# Patient Record
Sex: Female | Born: 1935 | Race: White | Hispanic: No | Smoking: Former smoker
Health system: Southern US, Community
[De-identification: ages and names within clinical notes are randomized; demographics above are authoritative.]

## PROBLEM LIST (undated history)

## (undated) HISTORY — PX: HERNIA REPAIR: SHX51

## (undated) HISTORY — PX: CHOLECYSTECTOMY: SHX55

## (undated) HISTORY — PX: FRACTURE SURGERY: SHX138

## (undated) HISTORY — PX: ABDOMINAL HYSTERECTOMY: SHX81

## (undated) HISTORY — PX: CATARACT EXTRACTION W/ INTRAOCULAR LENS  IMPLANT, BILATERAL: SHX1307

---

## 2007-09-10 ENCOUNTER — Emergency Department (HOSPITAL_COMMUNITY): Admission: EM | Admit: 2007-09-10 | Discharge: 2007-09-10 | Payer: Self-pay | Admitting: Emergency Medicine

## 2017-05-25 ENCOUNTER — Encounter (HOSPITAL_COMMUNITY): Payer: Self-pay | Admitting: *Deleted

## 2017-05-25 ENCOUNTER — Inpatient Hospital Stay (HOSPITAL_COMMUNITY): Payer: Medicare Other

## 2017-05-25 ENCOUNTER — Encounter (HOSPITAL_COMMUNITY)
Admission: AD | Disposition: A | Discharge status: Skilled Nursing Facility | Payer: Self-pay | Source: Other Acute Inpatient Hospital | Attending: Family Medicine

## 2017-05-25 ENCOUNTER — Inpatient Hospital Stay (HOSPITAL_COMMUNITY)
Admission: AD | Admit: 2017-05-25 | Discharge: 2017-06-01 | DRG: 481 | Disposition: A | Discharge status: Skilled Nursing Facility | Payer: Medicare Other | Source: Other Acute Inpatient Hospital | Attending: Internal Medicine | Admitting: Internal Medicine

## 2017-05-25 ENCOUNTER — Inpatient Hospital Stay (HOSPITAL_COMMUNITY): Payer: Medicare Other | Admitting: Certified Registered Nurse Anesthetist

## 2017-05-25 DIAGNOSIS — R410 Disorientation, unspecified: Secondary | ICD-10-CM | POA: Diagnosis not present

## 2017-05-25 DIAGNOSIS — M8000XA Age-related osteoporosis with current pathological fracture, unspecified site, initial encounter for fracture: Secondary | ICD-10-CM | POA: Diagnosis not present

## 2017-05-25 DIAGNOSIS — S72002A Fracture of unspecified part of neck of left femur, initial encounter for closed fracture: Secondary | ICD-10-CM | POA: Diagnosis not present

## 2017-05-25 DIAGNOSIS — I129 Hypertensive chronic kidney disease with stage 1 through stage 4 chronic kidney disease, or unspecified chronic kidney disease: Secondary | ICD-10-CM | POA: Diagnosis present

## 2017-05-25 DIAGNOSIS — M810 Age-related osteoporosis without current pathological fracture: Secondary | ICD-10-CM | POA: Diagnosis present

## 2017-05-25 DIAGNOSIS — M21372 Foot drop, left foot: Secondary | ICD-10-CM | POA: Diagnosis not present

## 2017-05-25 DIAGNOSIS — D72829 Elevated white blood cell count, unspecified: Secondary | ICD-10-CM | POA: Diagnosis present

## 2017-05-25 DIAGNOSIS — Z6820 Body mass index (BMI) 20.0-20.9, adult: Secondary | ICD-10-CM | POA: Diagnosis not present

## 2017-05-25 DIAGNOSIS — D62 Acute posthemorrhagic anemia: Secondary | ICD-10-CM | POA: Diagnosis not present

## 2017-05-25 DIAGNOSIS — W1830XA Fall on same level, unspecified, initial encounter: Secondary | ICD-10-CM | POA: Diagnosis present

## 2017-05-25 DIAGNOSIS — F0390 Unspecified dementia without behavioral disturbance: Secondary | ICD-10-CM | POA: Diagnosis present

## 2017-05-25 DIAGNOSIS — R262 Difficulty in walking, not elsewhere classified: Secondary | ICD-10-CM | POA: Diagnosis not present

## 2017-05-25 DIAGNOSIS — I10 Essential (primary) hypertension: Secondary | ICD-10-CM | POA: Diagnosis not present

## 2017-05-25 DIAGNOSIS — S72009A Fracture of unspecified part of neck of unspecified femur, initial encounter for closed fracture: Secondary | ICD-10-CM

## 2017-05-25 DIAGNOSIS — N189 Chronic kidney disease, unspecified: Secondary | ICD-10-CM

## 2017-05-25 DIAGNOSIS — N183 Chronic kidney disease, stage 3 (moderate): Secondary | ICD-10-CM | POA: Diagnosis present

## 2017-05-25 DIAGNOSIS — M25552 Pain in left hip: Secondary | ICD-10-CM | POA: Diagnosis present

## 2017-05-25 DIAGNOSIS — Z66 Do not resuscitate: Secondary | ICD-10-CM | POA: Diagnosis present

## 2017-05-25 DIAGNOSIS — E46 Unspecified protein-calorie malnutrition: Secondary | ICD-10-CM | POA: Diagnosis not present

## 2017-05-25 DIAGNOSIS — W19XXXA Unspecified fall, initial encounter: Secondary | ICD-10-CM

## 2017-05-25 DIAGNOSIS — Z79899 Other long term (current) drug therapy: Secondary | ICD-10-CM

## 2017-05-25 DIAGNOSIS — S72142A Displaced intertrochanteric fracture of left femur, initial encounter for closed fracture: Secondary | ICD-10-CM | POA: Diagnosis present

## 2017-05-25 DIAGNOSIS — I493 Ventricular premature depolarization: Secondary | ICD-10-CM | POA: Diagnosis not present

## 2017-05-25 DIAGNOSIS — Z23 Encounter for immunization: Secondary | ICD-10-CM | POA: Diagnosis present

## 2017-05-25 DIAGNOSIS — M25551 Pain in right hip: Secondary | ICD-10-CM | POA: Diagnosis not present

## 2017-05-25 DIAGNOSIS — R159 Full incontinence of feces: Secondary | ICD-10-CM | POA: Diagnosis present

## 2017-05-25 DIAGNOSIS — W19XXXD Unspecified fall, subsequent encounter: Secondary | ICD-10-CM | POA: Diagnosis not present

## 2017-05-25 DIAGNOSIS — R451 Restlessness and agitation: Secondary | ICD-10-CM | POA: Diagnosis not present

## 2017-05-25 DIAGNOSIS — E440 Moderate protein-calorie malnutrition: Secondary | ICD-10-CM | POA: Diagnosis present

## 2017-05-25 DIAGNOSIS — Z95810 Presence of automatic (implantable) cardiac defibrillator: Secondary | ICD-10-CM

## 2017-05-25 DIAGNOSIS — T148XXA Other injury of unspecified body region, initial encounter: Secondary | ICD-10-CM | POA: Diagnosis not present

## 2017-05-25 DIAGNOSIS — H9190 Unspecified hearing loss, unspecified ear: Secondary | ICD-10-CM | POA: Diagnosis not present

## 2017-05-25 DIAGNOSIS — D649 Anemia, unspecified: Secondary | ICD-10-CM

## 2017-05-25 DIAGNOSIS — N179 Acute kidney failure, unspecified: Secondary | ICD-10-CM | POA: Diagnosis present

## 2017-05-25 DIAGNOSIS — R296 Repeated falls: Secondary | ICD-10-CM

## 2017-05-25 DIAGNOSIS — Z96649 Presence of unspecified artificial hip joint: Secondary | ICD-10-CM

## 2017-05-25 HISTORY — PX: INTRAMEDULLARY (IM) NAIL INTERTROCHANTERIC: SHX5875

## 2017-05-25 HISTORY — PX: HIP FRACTURE SURGERY: SHX118

## 2017-05-25 LAB — COMPREHENSIVE METABOLIC PANEL
ALBUMIN: 3.4 g/dL — AB (ref 3.5–5.0)
ALK PHOS: 50 U/L (ref 38–126)
ALT: 13 U/L — ABNORMAL LOW (ref 14–54)
ANION GAP: 9 (ref 5–15)
AST: 21 U/L (ref 15–41)
BUN: 26 mg/dL — ABNORMAL HIGH (ref 6–20)
CALCIUM: 9.2 mg/dL (ref 8.9–10.3)
CO2: 27 mmol/L (ref 22–32)
Chloride: 106 mmol/L (ref 101–111)
Creatinine, Ser: 1.23 mg/dL — ABNORMAL HIGH (ref 0.44–1.00)
GFR calc Af Amer: 42 mL/min — ABNORMAL LOW (ref >60)
GFR calc non Af Amer: 36 mL/min — ABNORMAL LOW (ref >60)
GLUCOSE: 138 mg/dL — AB (ref 65–99)
POTASSIUM: 3.8 mmol/L (ref 3.5–5.1)
SODIUM: 142 mmol/L (ref 135–145)
Total Bilirubin: 0.7 mg/dL (ref 0.3–1.2)
Total Protein: 5.8 g/dL — ABNORMAL LOW (ref 6.5–8.1)

## 2017-05-25 LAB — CBC WITH DIFFERENTIAL/PLATELET
BASOS PCT: 0 %
Basophils Absolute: 0 10*3/uL (ref 0.0–0.1)
EOS ABS: 0 10*3/uL (ref 0.0–0.7)
Eosinophils Relative: 0 %
HCT: 27.8 % — ABNORMAL LOW (ref 36.0–46.0)
HEMOGLOBIN: 9.1 g/dL — AB (ref 12.0–15.0)
Lymphocytes Relative: 7 %
Lymphs Abs: 1.1 10*3/uL (ref 0.7–4.0)
MCH: 32.3 pg (ref 26.0–34.0)
MCHC: 32.7 g/dL (ref 30.0–36.0)
MCV: 98.6 fL (ref 78.0–100.0)
MONOS PCT: 7 %
Monocytes Absolute: 1 10*3/uL (ref 0.1–1.0)
NEUTROS PCT: 86 %
Neutro Abs: 12.3 10*3/uL — ABNORMAL HIGH (ref 1.7–7.7)
Platelets: 203 10*3/uL (ref 150–400)
RBC: 2.82 MIL/uL — ABNORMAL LOW (ref 3.87–5.11)
RDW: 13.5 % (ref 11.5–15.5)
WBC: 14.4 10*3/uL — AB (ref 4.0–10.5)

## 2017-05-25 LAB — MRSA PCR SCREENING: MRSA by PCR: NEGATIVE

## 2017-05-25 LAB — CREATININE, SERUM
CREATININE: 1.18 mg/dL — AB (ref 0.44–1.00)
GFR calc non Af Amer: 38 mL/min — ABNORMAL LOW (ref >60)
GFR, EST AFRICAN AMERICAN: 44 mL/min — AB (ref >60)

## 2017-05-25 LAB — PROTIME-INR
INR: 1.14
Prothrombin Time: 14.6 seconds (ref 11.4–15.2)

## 2017-05-25 LAB — CBC
HCT: 26.1 % — ABNORMAL LOW (ref 36.0–46.0)
Hemoglobin: 8.5 g/dL — ABNORMAL LOW (ref 12.0–15.0)
MCH: 32.2 pg (ref 26.0–34.0)
MCHC: 32.6 g/dL (ref 30.0–36.0)
MCV: 98.9 fL (ref 78.0–100.0)
PLATELETS: 204 10*3/uL (ref 150–400)
RBC: 2.64 MIL/uL — AB (ref 3.87–5.11)
RDW: 13.8 % (ref 11.5–15.5)
WBC: 17.2 10*3/uL — AB (ref 4.0–10.5)

## 2017-05-25 LAB — APTT: APTT: 25 s (ref 24–36)

## 2017-05-25 SURGERY — FIXATION, FRACTURE, INTERTROCHANTERIC, WITH INTRAMEDULLARY ROD
Anesthesia: Monitor Anesthesia Care | Site: Hip | Laterality: Left

## 2017-05-25 MED ORDER — OXYCODONE-ACETAMINOPHEN 5-325 MG PO TABS
1.0000 | ORAL_TABLET | ORAL | Status: DC | PRN
  Administered 2017-05-25 (×2): 1 via ORAL
  Filled 2017-05-25 (×2): qty 1

## 2017-05-25 MED ORDER — METOCLOPRAMIDE HCL 5 MG PO TABS: 5.0000 mg | ORAL_TABLET | Freq: Three times a day (TID) | ORAL | Status: DC | PRN

## 2017-05-25 MED ORDER — ENOXAPARIN SODIUM 40 MG/0.4ML ~~LOC~~ SOLN
40.0000 mg | SUBCUTANEOUS | Status: DC
  Filled 2017-05-25: qty 0.4

## 2017-05-25 MED ORDER — LISINOPRIL 10 MG PO TABS
10.0000 mg | ORAL_TABLET | Freq: Every day | ORAL | Status: DC
  Administered 2017-05-26: 10 mg via ORAL
  Filled 2017-05-25 (×2): qty 1

## 2017-05-25 MED ORDER — MENTHOL 3 MG MT LOZG: 1.0000 | LOZENGE | OROMUCOSAL | Status: DC | PRN

## 2017-05-25 MED ORDER — OXYCODONE HCL 5 MG PO TABS: 5.0000 mg | ORAL_TABLET | Freq: Once | ORAL | Status: DC | PRN

## 2017-05-25 MED ORDER — METHOCARBAMOL 1000 MG/10ML IJ SOLN
500.0000 mg | Freq: Four times a day (QID) | INTRAMUSCULAR | Status: DC | PRN
  Administered 2017-05-26 – 2017-05-29 (×4): 500 mg via INTRAVENOUS
  Filled 2017-05-25: qty 550
  Filled 2017-05-25 (×8): qty 5

## 2017-05-25 MED ORDER — MORPHINE SULFATE (PF) 4 MG/ML IV SOLN
0.5000 mg | INTRAVENOUS | Status: DC | PRN
  Administered 2017-05-25 – 2017-05-26 (×2): 0.52 mg via INTRAVENOUS
  Filled 2017-05-25 (×2): qty 1

## 2017-05-25 MED ORDER — PHENYLEPHRINE HCL 10 MG/ML IJ SOLN
INTRAMUSCULAR | Status: DC | PRN
  Administered 2017-05-25: 80 ug via INTRAVENOUS

## 2017-05-25 MED ORDER — MORPHINE SULFATE (PF) 4 MG/ML IV SOLN
INTRAVENOUS | Status: AC
  Filled 2017-05-25: qty 1

## 2017-05-25 MED ORDER — VITAMIN D 1000 UNITS PO TABS
1000.0000 [IU] | ORAL_TABLET | Freq: Every day | ORAL | Status: DC
  Administered 2017-05-25 – 2017-06-01 (×8): 1000 [IU] via ORAL
  Filled 2017-05-25 (×9): qty 1

## 2017-05-25 MED ORDER — METHOCARBAMOL 500 MG PO TABS
500.0000 mg | ORAL_TABLET | Freq: Three times a day (TID) | ORAL | Status: DC | PRN
  Administered 2017-05-25: 500 mg via ORAL
  Filled 2017-05-25: qty 1

## 2017-05-25 MED ORDER — SODIUM CHLORIDE 0.9 % IV SOLN
INTRAVENOUS | Status: DC
  Administered 2017-05-25 – 2017-05-26 (×2): via INTRAVENOUS

## 2017-05-25 MED ORDER — METOPROLOL TARTRATE 25 MG PO TABS
25.0000 mg | ORAL_TABLET | Freq: Every day | ORAL | Status: DC
  Administered 2017-05-25 – 2017-05-30 (×6): 25 mg via ORAL
  Filled 2017-05-25 (×6): qty 1

## 2017-05-25 MED ORDER — LISINOPRIL 10 MG PO TABS: 10.0000 mg | ORAL_TABLET | Freq: Every day | ORAL | Status: DC

## 2017-05-25 MED ORDER — CEFAZOLIN SODIUM-DEXTROSE 2-4 GM/100ML-% IV SOLN
2.0000 g | INTRAVENOUS | Status: AC
  Administered 2017-05-25: 2 g via INTRAVENOUS
  Filled 2017-05-25: qty 100

## 2017-05-25 MED ORDER — POVIDONE-IODINE 10 % EX SWAB
2.0000 "application " | Freq: Once | CUTANEOUS | Status: AC
  Administered 2017-05-25: 2 via TOPICAL

## 2017-05-25 MED ORDER — OXYCODONE HCL 5 MG PO TABS: 5.0000 mg | ORAL_TABLET | ORAL | 0 refills | Status: AC | PRN

## 2017-05-25 MED ORDER — ACETAMINOPHEN 325 MG PO TABS
650.0000 mg | ORAL_TABLET | Freq: Four times a day (QID) | ORAL | Status: DC | PRN
  Administered 2017-05-26: 650 mg via ORAL
  Filled 2017-05-25 (×2): qty 2

## 2017-05-25 MED ORDER — PROPOFOL 10 MG/ML IV BOLUS
INTRAVENOUS | Status: DC | PRN
  Administered 2017-05-25: 20 mg via INTRAVENOUS

## 2017-05-25 MED ORDER — ACETAMINOPHEN 650 MG RE SUPP: 650.0000 mg | Freq: Four times a day (QID) | RECTAL | Status: DC | PRN

## 2017-05-25 MED ORDER — HYDROCHLOROTHIAZIDE 10 MG/ML ORAL SUSPENSION: 6.2500 mg | Freq: Every day | ORAL | Status: DC

## 2017-05-25 MED ORDER — ALUM & MAG HYDROXIDE-SIMETH 200-200-20 MG/5ML PO SUSP: 30.0000 mL | ORAL | Status: DC | PRN

## 2017-05-25 MED ORDER — HYDRALAZINE HCL 20 MG/ML IJ SOLN: 5.0000 mg | INTRAMUSCULAR | Status: DC | PRN

## 2017-05-25 MED ORDER — ONDANSETRON HCL 4 MG/2ML IJ SOLN: 4.0000 mg | Freq: Three times a day (TID) | INTRAMUSCULAR | Status: DC | PRN

## 2017-05-25 MED ORDER — PHENOL 1.4 % MT LIQD
1.0000 | OROMUCOSAL | Status: DC | PRN
Start: 2017-05-25 — End: 2017-06-01

## 2017-05-25 MED ORDER — PROPOFOL 500 MG/50ML IV EMUL
INTRAVENOUS | Status: DC | PRN
  Administered 2017-05-25: 20 ug/kg/min via INTRAVENOUS

## 2017-05-25 MED ORDER — SODIUM CHLORIDE 0.9 % IV SOLN
INTRAVENOUS | Status: DC
  Administered 2017-05-25 – 2017-05-30 (×6): via INTRAVENOUS

## 2017-05-25 MED ORDER — CEFAZOLIN SODIUM-DEXTROSE 2-4 GM/100ML-% IV SOLN
2.0000 g | Freq: Four times a day (QID) | INTRAVENOUS | Status: AC
  Administered 2017-05-25 – 2017-05-26 (×3): 2 g via INTRAVENOUS
  Filled 2017-05-25 (×3): qty 100

## 2017-05-25 MED ORDER — ENOXAPARIN SODIUM 30 MG/0.3ML ~~LOC~~ SOLN: 30.0000 mg | SUBCUTANEOUS | 0 refills | Status: AC

## 2017-05-25 MED ORDER — OXYCODONE HCL 5 MG PO TABS
5.0000 mg | ORAL_TABLET | ORAL | Status: DC | PRN
  Administered 2017-05-25 – 2017-05-26 (×2): 10 mg via ORAL
  Administered 2017-05-28 – 2017-05-29 (×3): 5 mg via ORAL
  Administered 2017-05-30 – 2017-05-31 (×2): 10 mg via ORAL
  Filled 2017-05-25 (×2): qty 2
  Filled 2017-05-25 (×2): qty 1
  Filled 2017-05-25 (×2): qty 2
  Filled 2017-05-25: qty 1

## 2017-05-25 MED ORDER — PHENYLEPHRINE HCL 10 MG/ML IJ SOLN
INTRAVENOUS | Status: DC | PRN
  Administered 2017-05-25: 30 ug/min via INTRAVENOUS

## 2017-05-25 MED ORDER — ONDANSETRON HCL 4 MG PO TABS: 4.0000 mg | ORAL_TABLET | Freq: Four times a day (QID) | ORAL | Status: DC | PRN

## 2017-05-25 MED ORDER — ONDANSETRON HCL 4 MG/2ML IJ SOLN
INTRAMUSCULAR | Status: AC
  Filled 2017-05-25: qty 2

## 2017-05-25 MED ORDER — ACETAMINOPHEN 325 MG PO TABS
650.0000 mg | ORAL_TABLET | Freq: Four times a day (QID) | ORAL | Status: DC | PRN
  Administered 2017-05-30: 650 mg via ORAL
  Filled 2017-05-25: qty 2

## 2017-05-25 MED ORDER — METHOCARBAMOL 500 MG PO TABS
500.0000 mg | ORAL_TABLET | Freq: Four times a day (QID) | ORAL | Status: DC | PRN
  Administered 2017-05-29 – 2017-06-01 (×6): 500 mg via ORAL
  Filled 2017-05-25 (×7): qty 1

## 2017-05-25 MED ORDER — HYDROCODONE-ACETAMINOPHEN 5-325 MG PO TABS
1.0000 | ORAL_TABLET | Freq: Four times a day (QID) | ORAL | Status: DC | PRN
  Administered 2017-05-26: 2 via ORAL
  Administered 2017-05-26 – 2017-05-29 (×2): 1 via ORAL
  Administered 2017-05-30 – 2017-05-31 (×3): 2 via ORAL
  Administered 2017-05-31: 1 via ORAL
  Administered 2017-06-01 (×2): 2 via ORAL
  Filled 2017-05-25: qty 2
  Filled 2017-05-25: qty 1
  Filled 2017-05-25: qty 2
  Filled 2017-05-25: qty 1
  Filled 2017-05-25 (×3): qty 2
  Filled 2017-05-25: qty 1
  Filled 2017-05-25: qty 2

## 2017-05-25 MED ORDER — HYDROCHLOROTHIAZIDE 10 MG/ML ORAL SUSPENSION
6.2500 mg | Freq: Every day | ORAL | Status: DC
  Administered 2017-05-26: 6.25 mg via ORAL
  Filled 2017-05-25 (×2): qty 1.25

## 2017-05-25 MED ORDER — LISINOPRIL-HYDROCHLOROTHIAZIDE 20-12.5 MG PO TABS: 0.5000 | ORAL_TABLET | Freq: Every day | ORAL | Status: DC

## 2017-05-25 MED ORDER — 0.9 % SODIUM CHLORIDE (POUR BTL) OPTIME
TOPICAL | Status: DC | PRN
  Administered 2017-05-25: 1000 mL

## 2017-05-25 MED ORDER — OXYCODONE HCL 5 MG/5ML PO SOLN: 5.0000 mg | Freq: Once | ORAL | Status: DC | PRN

## 2017-05-25 MED ORDER — FENTANYL CITRATE (PF) 100 MCG/2ML IJ SOLN
25.0000 ug | INTRAMUSCULAR | Status: DC | PRN
Start: 2017-05-25 — End: 2017-05-25
  Administered 2017-05-25 (×4): 25 ug via INTRAVENOUS

## 2017-05-25 MED ORDER — ONDANSETRON HCL 4 MG/2ML IJ SOLN
4.0000 mg | Freq: Four times a day (QID) | INTRAMUSCULAR | Status: DC | PRN
  Administered 2017-05-25 (×2): 4 mg via INTRAVENOUS
  Filled 2017-05-25: qty 2

## 2017-05-25 MED ORDER — MORPHINE SULFATE (PF) 4 MG/ML IV SOLN
0.5000 mg | INTRAVENOUS | Status: DC | PRN
  Administered 2017-05-25 (×3): 0.52 mg via INTRAVENOUS
  Filled 2017-05-25 (×2): qty 1

## 2017-05-25 MED ORDER — FENTANYL CITRATE (PF) 250 MCG/5ML IJ SOLN
INTRAMUSCULAR | Status: AC
  Filled 2017-05-25: qty 5

## 2017-05-25 MED ORDER — ZOLPIDEM TARTRATE 5 MG PO TABS
5.0000 mg | ORAL_TABLET | Freq: Every evening | ORAL | Status: DC | PRN
  Administered 2017-05-25 – 2017-05-31 (×5): 5 mg via ORAL
  Filled 2017-05-25 (×5): qty 1

## 2017-05-25 MED ORDER — LACTATED RINGERS IV SOLN
INTRAVENOUS | Status: DC
  Administered 2017-05-25: 15:00:00 via INTRAVENOUS

## 2017-05-25 MED ORDER — TRANEXAMIC ACID 1000 MG/10ML IV SOLN
1000.0000 mg | INTRAVENOUS | Status: AC
  Administered 2017-05-25: 1000 mg via INTRAVENOUS
  Filled 2017-05-25: qty 10

## 2017-05-25 MED ORDER — METOCLOPRAMIDE HCL 5 MG/ML IJ SOLN: 5.0000 mg | Freq: Three times a day (TID) | INTRAMUSCULAR | Status: DC | PRN

## 2017-05-25 MED ORDER — FENTANYL CITRATE (PF) 100 MCG/2ML IJ SOLN
INTRAMUSCULAR | Status: AC
  Administered 2017-05-25: 25 ug via INTRAVENOUS
  Filled 2017-05-25: qty 2

## 2017-05-25 SURGICAL SUPPLY — 35 items
BNDG COHESIVE 4X5 TAN NS LF (GAUZE/BANDAGES/DRESSINGS) ×3 IMPLANT
BNDG COHESIVE 6X5 TAN STRL LF (GAUZE/BANDAGES/DRESSINGS) IMPLANT
BNDG GAUZE ELAST 4 BULKY (GAUZE/BANDAGES/DRESSINGS) ×3 IMPLANT
COVER PERINEAL POST (MISCELLANEOUS) ×3 IMPLANT
COVER SURGICAL LIGHT HANDLE (MISCELLANEOUS) ×3 IMPLANT
DRAPE STERI IOBAN 125X83 (DRAPES) ×3 IMPLANT
DRSG MEPILEX BORDER 4X4 (GAUZE/BANDAGES/DRESSINGS) ×6 IMPLANT
DRSG MEPILEX BORDER 4X8 (GAUZE/BANDAGES/DRESSINGS) ×3 IMPLANT
DRSG PAD ABDOMINAL 8X10 ST (GAUZE/BANDAGES/DRESSINGS) ×6 IMPLANT
DURAPREP 26ML APPLICATOR (WOUND CARE) ×3 IMPLANT
ELECT REM PT RETURN 9FT ADLT (ELECTROSURGICAL) ×3
ELECTRODE REM PT RTRN 9FT ADLT (ELECTROSURGICAL) ×1 IMPLANT
GLOVE SKINSENSE NS SZ7.5 (GLOVE) ×4
GLOVE SKINSENSE STRL SZ7.5 (GLOVE) ×2 IMPLANT
GOWN STRL REIN XL XLG (GOWN DISPOSABLE) ×3 IMPLANT
GUIDE PIN 3.2MM (MISCELLANEOUS) ×2
GUIDE PIN ORTH 343X3.2XBRAD (MISCELLANEOUS) ×1 IMPLANT
KIT BASIN OR (CUSTOM PROCEDURE TRAY) ×3 IMPLANT
KIT ROOM TURNOVER OR (KITS) ×3 IMPLANT
MANIFOLD NEPTUNE II (INSTRUMENTS) ×3 IMPLANT
NAIL FEM 11.5X1.5X340 125D LT (Nail) ×3 IMPLANT
NS IRRIG 1000ML POUR BTL (IV SOLUTION) ×3 IMPLANT
PACK GENERAL/GYN (CUSTOM PROCEDURE TRAY) ×3 IMPLANT
PAD ARMBOARD 7.5X6 YLW CONV (MISCELLANEOUS) ×6 IMPLANT
PAD CAST 4YDX4 CTTN HI CHSV (CAST SUPPLIES) ×2 IMPLANT
PADDING CAST COTTON 4X4 STRL (CAST SUPPLIES) ×4
SCREW LAG COMPR KIT 95/90 (Screw) ×3 IMPLANT
STAPLER VISISTAT 35W (STAPLE) ×6 IMPLANT
SUT VIC AB 0 CT1 27 (SUTURE) ×2
SUT VIC AB 0 CT1 27XBRD ANBCTR (SUTURE) ×1 IMPLANT
SUT VIC AB 2-0 CT1 27 (SUTURE) ×4
SUT VIC AB 2-0 CT1 TAPERPNT 27 (SUTURE) ×2 IMPLANT
TOWEL OR 17X24 6PK STRL BLUE (TOWEL DISPOSABLE) ×3 IMPLANT
TOWEL OR 17X26 10 PK STRL BLUE (TOWEL DISPOSABLE) ×3 IMPLANT
WATER STERILE IRR 1000ML POUR (IV SOLUTION) ×3 IMPLANT

## 2017-05-25 NOTE — Op Note (Signed)
   Date of Surgery: 05/25/2017  INDICATIONS: Charlotte Huber is a 83 y.o.-year-old female who sustained a left hip fracture. The risks and benefits of the procedure discussed with the patient prior to the procedure and all questions were answered; consent was obtained.  PREOPERATIVE DIAGNOSIS: left hip fracture   POSTOPERATIVE DIAGNOSIS: Same   PROCEDURE: Treatment of intertrochanteric fracture with intramedullary implant. CPT [phone]   SURGEON: N. Glee ArvinMichael Sherrine Salberg, M.D.   ASSIST: Starlyn SkeansMary Lindsey TiptonStanbery, [place]; necessary for the timely completion of procedure and due to complexity of procedure.  ANESTHESIA: general   IV FLUIDS AND URINE: See anesthesia record   ESTIMATED BLOOD LOSS: 200 cc  IMPLANTS: [employer]AN 11.5 x 34, 95/90  DRAINS: None.   COMPLICATIONS: None.   DESCRIPTION OF PROCEDURE: The patient was brought to the operating room and placed supine on the operating table. The patient's leg had been signed prior to the procedure. The patient had the anesthesia placed by the anesthesiologist. The prep verification and incision time-outs were performed to confirm that this was the correct patient, site, side and location. The patient had an SCD on the opposite lower extremity. The patient did receive antibiotics prior to the incision and was re-dosed during the procedure as needed at indicated intervals. The patient was positioned on the fracture table with the table in traction and internal rotation to reduce the hip. The well leg was placed in a scissor position and all bony prominences were well-padded. The patient had the lower extremity prepped and draped in the standard surgical fashion. The incision was made 4 finger breadths superior to the greater trochanter. A guide pin was inserted into the tip of the greater trochanter under fluoroscopic guidance. An opening reamer was used to gain access to the femoral canal. The nail length was measured and inserted down the femoral canal  to its proper depth. The appropriate version of insertion for the lag screw was found under fluoroscopy. A pin was inserted up the femoral neck through the jig. Then, a second antirotation pin was inserted inferior to the first pin. The length of the lag screw was then measured. The lag screw was inserted as near to center-center in the head as possible. The antirotation pin was then taken out and an interdigitating compression screw was placed in its place. The leg was taken out of traction, then the interdigitating compression screw was used to compress across the fracture. Compression was visualized on serial xrays. The wound was copiously irrigated with saline and the subcutaneous layer closed with 2.0 vicryl and the skin was reapproximated with staples. The wounds were cleaned and dried a final time and a sterile dressing was placed. The hip was taken through a range of motion at the end of the case under fluoroscopic imaging to visualize the approach-withdraw phenomenon and confirm implant length in the head. The patient was then awakened from anesthesia and taken to the recovery room in stable condition. All counts were correct at the end of the case.   POSTOPERATIVE PLAN: The patient will be weight bearing as tolerated and will return in 2 weeks for staple removal and the patient will receive DVT prophylaxis based on other medications, activity level, and risk ratio of bleeding to thrombosis.   Charlotte ReelN. Michael Conleigh Heinlein, MD [facility] [phone] 5:20 PM

## 2017-05-25 NOTE — Consult Note (Signed)
   ORTHOPAEDIC CONSULTATION  REQUESTING PHYSICIAN: Darlin DropHall, Carole N, DO  Chief Complaint: Left intertroch hip fracture  HPI: Charlotte Huber is a 83 y.o. female who presents with left hip fracture s/p mechanical fall PTA.  The patient endorses severe pain in the left hip, that does not radiate, grinding in quality, worse with any movement, better with immobilization.  Denies LOC/fever/chills/nausea/vomiting.  Walks without assistive devices (walker, cane, wheelchair).  Does live independently with sons.  Denies LOC, neck pain, abd pain.  PMHx is significant for pacemaker.  Social History   Socioeconomic History  . Marital status: Widowed    Spouse name: Not on file  . Number of children: Not on file  . Years of education: Not on file  . Highest education level: Not on file  Social Needs  . Financial resource strain: Not on file  . Food insecurity - worry: Not on file  . Food insecurity - inability: Not on file  . Transportation needs - medical: Not on file  . Transportation needs - non-medical: Not on file  Occupational History  . Not on file  Tobacco Use  . Smoking status: Not on file  Substance and Sexual Activity  . Alcohol use: Not on file  . Drug use: Not on file  . Sexual activity: Not on file  Other Topics Concern  . Not on file  Social History Narrative  . Not on file   No family history on file. Allergies not on file Prior to Admission medications   Not on File   No results found.  All pertinent xrays, MRI, CT independently reviewed and interpreted  Positive ROS: All other systems have been reviewed and were otherwise negative with the exception of those mentioned in the HPI and as above.  Physical Exam: General: Alert, no acute distress Cardiovascular: No pedal edema Respiratory: No cyanosis, no use of accessory musculature GI: No organomegaly, abdomen is soft and non-tender Skin: No lesions in the area of chief complaint Neurologic: Sensation intact  distally Psychiatric: Patient is competent for consent with normal mood and affect Lymphatic: No axillary or cervical lymphadenopathy  MUSCULOSKELETAL:  - pain with movement of the hip and extremity - skin intact - NVI distally - compartments soft  Assessment: Left intertroch hip fracture  Plan: - surgery is recommended, patient and family are aware of r/b/a and wish to proceed - consent obtained - medical optimization per primary team - surgery is planned for this afternoon pending medical clearance.  Thank you for the consult and the opportunity to see Ms. Prestridge  N. Glee ArvinMichael Rushawn Capshaw, MD [facility] [phone] 7:29 AM

## 2017-05-25 NOTE — Care Management (Signed)
This is a no charge note   [place] Dr. Lyman BishopSilver  83 year old lady with past medical history for hypertension, CAD, pacemaker placement, who presents with fall, and caused left hip fracture. Orthopedic surgeon, Dr. Rayburn MaBlackmon was consulted.   Patient was found to have hemoglobin 12.6, electrolytes renal function okay, blood pressure 169/82, heart rate 78, respiration rate 15, oxygen saturation normal on 2 L nasal cannula oxygen, temperature normal, negative urinalysis.  Patient is accepted to MedSurg bed as inpatient.  Please call manager of [facility] at [phone] when pt arrives to floor   Lorretta HarpXilin Jahzion Brogden, MD  [facility] Pager [phone]  If 7PM-7AM, please contact night-coverage [email or web address] Password [email or web address] 05/25/2017, 1:47 AM

## 2017-05-25 NOTE — Discharge Instructions (Signed)
° ° °  1. Change dressings as needed °2. May shower but keep incisions covered and dry °3. Take lovenox to prevent blood clots °4. Take stool softeners as needed °5. Take pain meds as needed ° °

## 2017-05-25 NOTE — Transfer of Care (Signed)
Immediate Anesthesia Transfer of Care Note  Patient: Charlotte Huber  Procedure(s) Performed: INTRAMEDULLARY (IM) NAIL INTERTROCHANTRIC (Left Hip)  Patient Location: PACU  Anesthesia Type:Spinal  Level of Consciousness: alert   Airway & Oxygen Therapy: Patient Spontanous Breathing  Post-op Assessment: Report given to RN and Post -op Vital signs reviewed and stable  Post vital signs: Reviewed and stable  Last Vitals:  Vitals:   05/25/17 0445 05/25/17 1400  BP: (!) 162/62 (!) 112/57  Pulse: 80 (!) 101  Resp: 19 20  Temp: 37.5 C 36.4 C  SpO2: 93% 97%    Last Pain:  Vitals:   05/25/17 1400  TempSrc: Axillary  PainSc:          Complications: No apparent anesthesia complications

## 2017-05-25 NOTE — H&P (Signed)
History and Physical  Charlotte Haskellheo W Down VOZ:[id number]:[id number] DOB: 06/15/1918 DOA: 05/25/2017  Referring physician: Dr. Judd Lienelo  PCP: No primary care provider on file.  Outpatient Specialists: None Patient coming from: Home  Chief Complaint: Fall and left hip pain  HPI: Charlotte Huber is a 83 y.o. female with medical history significant for HTN, dementia, AICD, osteoporosis [facility] after an unwitnessed fall that occurred yesterday around 1700. Patient had bowel incontinence earlier that day. Prior to that was in her usual state of health.   ED Course: Pelvis portable xray revealed: Comminuted intertrochanteric fracture on the left with varus angulation at fracture site. No other fractures are evident. No dislocation. There is symmetric moderate narrowing of the hip joints bilaterally. Bones are diffusely osteoporotic.   Orthopedic surgery consulted with plan for surgical repair this afternoon 05/25/17. Chest xray and EKG ordered for medical clearance.  Review of Systems:  Review of systems as noted in the HPI are otherwise negative   Social History:  has no tobacco, alcohol, and drug history on file. Lives at home with her son. Walks independently with no use of assistive device.  Allergies not on file  No family history on file.  Unable to obtain due to dementia.  Prior to Admission medications   Not on File    Physical Exam: BP (!) 162/62   Pulse 80   Temp 99.5 F (37.5 C)   Resp 19   Wt 52 kg (114 lb 10.2 oz)   SpO2 93%   General:  83 yo thin built CF. Appears uncomfortable due to right hip pain. Very hard of hearing. Eyes: Pupils are round and reactive to light. Sclerae anicteric  ENT: Mucous membrane dry. No erythema or exudates Neck: No JVD noted, no thyromegaly Cardiovascular: IRR no rubs or gallops Respiratory: CTA no wheezes or rales Abdomen: soft NT ND NBS x4  Skin: No noted rashes. On limited exam  Musculoskeletal: left leg is  shortened Psychiatric: Mood is appropriate for condition and setting  Neurologic: Unable to fully assessed limited by pain          Labs on Admission:  Basic Metabolic Panel: Recent Labs  Lab 05/25/17 0551  NA 142  K 3.8  CL 106  CO2 27  GLUCOSE 138*  BUN 26*  CREATININE 1.23*  CALCIUM 9.2   Liver Function Tests: Recent Labs  Lab 05/25/17 0551  AST 21  ALT 13*  ALKPHOS 50  BILITOT 0.7  PROT 5.8*  ALBUMIN 3.4*   No results for input(s): LIPASE, AMYLASE in the last 168 hours. No results for input(s): AMMONIA in the last 168 hours. CBC: Recent Labs  Lab 05/25/17 0551  WBC 14.4*  NEUTROABS 12.3*  HGB 9.1*  HCT 27.8*  MCV 98.6  PLT 203   Cardiac Enzymes: No results for input(s): CKTOTAL, CKMB, CKMBINDEX, TROPONINI in the last 168 hours.  BNP (last 3 results) No results for input(s): BNP in the last 8760 hours.  ProBNP (last 3 results) No results for input(s): PROBNP in the last 8760 hours.  CBG: No results for input(s): GLUCAP in the last 168 hours.  Radiological Exams on Admission: Dg Pelvis Portable  Result Date: 05/25/2017 CLINICAL DATA:  Pain following fall EXAM: PORTABLE PELVIS 1-2 VIEWS COMPARISON:  None. FINDINGS: There is a comminuted intertrochanteric femur fracture on the left with varus angulation at the fracture site. There is avulsion of the lesser trochanter medially. No other fracture is appreciable. No dislocation. Bones are  diffusely osteoporotic. There is moderate narrowing of both hip joints. IMPRESSION: Comminuted intertrochanteric fracture on the left with varus angulation at fracture site. No other fractures are evident. No dislocation. There is symmetric moderate narrowing of the hip joints bilaterally. Bones are diffusely osteoporotic. These results will be called to the ordering clinician or representative by the Radiologist Assistant, and communication documented in the PACS or zVision Dashboard. Electronically Signed   By: Bretta BangWilliam   Woodruff III M.D.   On: 05/25/2017 07:45    EKG: Independently reviewed.  No EKG available at the time of this evaluation. Ordered it.  Assessment/Plan Present on Admission: . Closed left hip fracture (HCC)  Active Problems:   Closed left hip fracture (HCC)  Acute left comminuted intertrochanteric fracture post presumed mechanical fall, poa -ortho following with plan for surgery this afternoon -NPO -SCDs -pain management -gentle IV fluid hydration -close monitoring of vital signs -EKG, CXR for medical clearance  Suspected mechanical Fall -Post surgery will require PT -Son states no recent fall prior to this event  Osteoporosis -Not treated -Fall precaution -At home on vitamin D and calcium -No previous assistive device to ambulate  Elevated creatinine with no baseline -cr 1.23, bun 26, GFR 36 -gentle IV fluid hydration -BMP am  Normocytic anemia -No baseline to compare -Hg 9.1 -transfuse if Hg drops below 7.0 -CBC am  Moderate protein calorie malnutrition -Ensure after procedure or nutritionist consult  HTN -no acute issues  Leukocytosis most likely reactive -wbc 14k, afebrile -No sign of active infective process  Reported recent bowel incontinence -Will monitor -No recurrence since in the hospital -Physical exam unremarkable     DVT prophylaxis: SCDs-per ortho   Code Status: DNR  Family Communication: with sons at bedside. All questions answered to their satisfaction.   Disposition Plan: will stay at least 2 midnights. Surgery planned today 05/25/17.  Consults called: Orthopedic surgery  Admission status: Inpatient    Darlin Droparole N Orlyn Odonoghue MD [facility] Pager [phone]  If 7PM-7AM, please contact night-coverage [email or web address] Password [email or web address]  05/25/2017, 7:58 AM

## 2017-05-25 NOTE — Anesthesia Preprocedure Evaluation (Signed)
Anesthesia Evaluation  Patient identified by MRN, date of birth, ID band Patient awake    Reviewed: Allergy & Precautions, NPO status , Patient's Chart, lab work & pertinent test results, reviewed documented beta blocker date and time   History of Anesthesia Complications Negative for: history of anesthetic complications  Airway Mallampati: II  TM Distance: >3 FB Neck ROM: Full    Dental  (+) Dental Advisory Given   Pulmonary neg pulmonary ROS,    breath sounds clear to auscultation       Cardiovascular hypertension, Pt. on medications and Pt. on home beta blockers + dysrhythmias + pacemaker  Rhythm:Regular     Neuro/Psych negative neurological ROS  negative psych ROS   GI/Hepatic negative GI ROS, Neg liver ROS,   Endo/Other  negative endocrine ROS  Renal/GU negative Renal ROS     Musculoskeletal   Abdominal   Peds  Hematology  (+) anemia ,   Anesthesia Other Findings   Reproductive/Obstetrics                             Anesthesia Physical Anesthesia Plan  ASA: III  Anesthesia Plan: MAC and Spinal   Post-op Pain Management:    Induction:   PONV Risk Score and Plan: 2 and Ondansetron and Treatment may vary due to age or medical condition  Airway Management Planned: Nasal Cannula  Additional Equipment: None  Intra-op Plan:   Post-operative Plan:   Informed Consent: I have reviewed the patients History and Physical, chart, labs and discussed the procedure including the risks, benefits and alternatives for the proposed anesthesia with the patient or authorized representative who has indicated his/her understanding and acceptance.   Dental advisory given and Consent reviewed with POA  Plan Discussed with: CRNA and Surgeon  Anesthesia Plan Comments:         Anesthesia Quick Evaluation

## 2017-05-25 NOTE — Progress Notes (Addendum)
Pt received per stretcher. Pt is alert and oriented and very hard of hearing. Pt grimaces upon movement. Pt on 2 L O2. [facility]. Awaiting for orders.

## 2017-05-26 ENCOUNTER — Inpatient Hospital Stay (HOSPITAL_COMMUNITY): Payer: Medicare Other

## 2017-05-26 DIAGNOSIS — W19XXXD Unspecified fall, subsequent encounter: Secondary | ICD-10-CM

## 2017-05-26 LAB — BASIC METABOLIC PANEL
ANION GAP: 8 (ref 5–15)
BUN: 32 mg/dL — ABNORMAL HIGH (ref 6–20)
CALCIUM: 8.4 mg/dL — AB (ref 8.9–10.3)
CO2: 23 mmol/L (ref 22–32)
CREATININE: 1.44 mg/dL — AB (ref 0.44–1.00)
Chloride: 112 mmol/L — ABNORMAL HIGH (ref 101–111)
GFR, EST AFRICAN AMERICAN: 34 mL/min — AB (ref >60)
GFR, EST NON AFRICAN AMERICAN: 30 mL/min — AB (ref >60)
Glucose, Bld: 127 mg/dL — ABNORMAL HIGH (ref 65–99)
Potassium: 3.9 mmol/L (ref 3.5–5.1)
SODIUM: 143 mmol/L (ref 135–145)

## 2017-05-26 LAB — CBC
HEMATOCRIT: 23.4 % — AB (ref 36.0–46.0)
Hemoglobin: 7.3 g/dL — ABNORMAL LOW (ref 12.0–15.0)
MCH: 30.9 pg (ref 26.0–34.0)
MCHC: 31.2 g/dL (ref 30.0–36.0)
MCV: 99.2 fL (ref 78.0–100.0)
PLATELETS: 170 10*3/uL (ref 150–400)
RBC: 2.36 MIL/uL — ABNORMAL LOW (ref 3.87–5.11)
RDW: 13.9 % (ref 11.5–15.5)
WBC: 18.3 10*3/uL — AB (ref 4.0–10.5)

## 2017-05-26 MED ORDER — ENOXAPARIN SODIUM 30 MG/0.3ML ~~LOC~~ SOLN
30.0000 mg | SUBCUTANEOUS | Status: DC
  Administered 2017-05-26 – 2017-05-31 (×6): 30 mg via SUBCUTANEOUS
  Filled 2017-05-26 (×6): qty 0.3

## 2017-05-26 MED ORDER — MORPHINE SULFATE (PF) 4 MG/ML IV SOLN
1.0000 mg | INTRAVENOUS | Status: DC | PRN
  Administered 2017-05-26 – 2017-05-28 (×5): 1 mg via INTRAVENOUS
  Filled 2017-05-26 (×5): qty 1

## 2017-05-26 MED ORDER — SENNOSIDES-DOCUSATE SODIUM 8.6-50 MG PO TABS
1.0000 | ORAL_TABLET | Freq: Two times a day (BID) | ORAL | Status: DC
  Administered 2017-05-26 – 2017-06-01 (×9): 1 via ORAL
  Filled 2017-05-26 (×10): qty 1

## 2017-05-26 MED ORDER — POLYETHYLENE GLYCOL 3350 17 G PO PACK
17.0000 g | PACK | Freq: Two times a day (BID) | ORAL | Status: DC
  Administered 2017-05-26 – 2017-06-01 (×9): 17 g via ORAL
  Filled 2017-05-26 (×10): qty 1

## 2017-05-26 NOTE — Progress Notes (Signed)
PROGRESS NOTE    Charlotte Huber  UJW:[id number] DOB: 08/01/1918 DOA: 05/25/2017 PCP: Patient, No Pcp Per   Brief Narrative:  Charlotte Huber is a 83 y.o. female with medical history significant for HTN, dementia, AICD, osteoporosis who presented at [facility] from home after an unwitnessed fall that occurred yesterday around 1700. Patient had bowel incontinence earlier that day. Prior to that was in her usual state of health. In the ED a pelvis portable xray revealed: Comminuted intertrochanteric fracture on the left with varus angulation at fracture site. No other fractures are evident. No dislocation. There is symmetric moderate narrowing of the hip joints bilaterally. Bones are diffusely osteoporotic. Orthopedic surgery consulted and patient underwent surgical repair this afternoon 05/25/17. Today she was a little more confused.  Assessment & Plan:   Active Problems:   Closed left hip fracture (HCC)  Acute Left comminuted intertrochanteric fracture post presumed mechanical fall, poA s/p Surgical Treatment of the Intertrochanteric Fx  POD 1 -Ortho following and appreciate additional recc's -SCDs -Pain management with Hydrocodone-Acetaminophen 1-2 tab po q6hprn Moderate Pain along with Oxycodone 5-10 mg po q4hprn (for Breakthrough Moderate Pain) and IV Morphine 1 mg IV q2hprn for Sever Pain -Gentle IVF with NS at 75 mL/hr  Unwitnessed Fall -Checked Head CT No acute intracranial abnormality. Mild to moderate cerebral atrophy and chronic small vessel disease. -Interrogate Pacemaker for any Events -Will Place on Telemetry if patient allows  -PT recommends SNF -Falls Precautions  Osteoporosis -Not treated -Fall precaution -At home on vitamin D and calcium -No previous assistive device to ambulate  Elevated creatinine with no baseline suspect CKD Stage 3 -cr 1.23, bun 26, GFR 36 -C/w gentle IV fluid hydration -Repeat CMP in AM   Normocytic Anemia as well as ABLA -No baseline to  compare -Hg 9.1 on  Admission  -Post-Operative Drop as Hb/Hct went from 9.1/27.8 -> 8.5/26.1 -> 7.3/23.4 -Transfuse if Hg drops below 7.0 -Repeat CBC in AM   Moderate protein calorie malnutrition -Nutritionist Consulted   HTN -C/w Lisinopril/HCTZ 10-6.25 mg po Daily -C/w Metoprolol 25 mg po Daily  -c/w Hydralazine 5 mg IV q2hprn for SBP >175  Leukocytosis  -Most likely reactive from Pain and Post-Operative -wbc 14k on admission and worsened to 18.3 -No sign of active infective process -Continue to Monitor and Repeat CBC in AM  Reported recent bowel incontinence -Will monitor -No recurrence since in the hospital -Physical exam unremarkable  Acute Delirium/Agitation in the Setting of Dementia -Likely from Pain -Deliruim Precautions  -C/w Pain Control with Tylenol for Mild Pain Hydrocodone-Acetaminophen 1-2 tab po q6hprn Moderate Pain along with Oxycodone 5-10 mg po q4hprn (for Breakthrough Moderate Pain) and IV Morphine 1 mg IV q2hprn for Sever Pain  DVT prophylaxis: C/w Lovenox 30 mg sq q24h Code Status: DO NOT RESUSCITATE  Family Communication: Discussed with family at bedside  Disposition Plan: SNF when medically stable for D/C   Consultants:   Orthopedic Surgery  Procedures: Treatment of intertrochanteric fracture with intramedullary implant done by Dr. Roda Shutters POD1    Antimicrobials:  Anti-infectives (From admission, onward)   Start     Dose/Rate Route Frequency Ordered Stop   05/25/17 2015  ceFAZolin (ANCEF) IVPB 2g/100 mL premix     2 g 200 mL/hr over 30 Minutes Intravenous Every 6 hours 05/25/17 2003 05/26/17 0905   05/25/17 1045  ceFAZolin (ANCEF) IVPB 2g/100 mL premix     2 g 200 mL/hr over 30 Minutes Intravenous On call to O.R. 05/25/17 1043 05/25/17 1646  Subjective: Seen and examined and is extremely hard of hearing. Laying in chair next to bed. Family states she had some pain.   Objective: Vitals:   05/25/17 1940 05/25/17 2000 05/26/17 0322  05/26/17 0630  BP: 120/80 102/83 96/71 111/89  Pulse: (!) 115 (!) 105 64 68  Resp: 20 18 18    Temp:  97.6 F (36.4 C) (!) 97.5 F (36.4 C) 97.6 F (36.4 C)  TempSrc:   Axillary Axillary  SpO2: 100% 98% 100% 100%  Weight:        Intake/Output Summary (Last 24 hours) at 05/26/2017 [id number] Last data filed at 05/26/2017 0600 Gross per 24 hour  Intake 1738.33 ml  Output 250 ml  Net 1488.33 ml   Filed Weights   05/25/17 0300  Weight: 52 kg (114 lb 10.2 oz)   Examination: Physical Exam:  Constitutional: Thin frail Caucasian female in NAD and appears calm  Eyes: Lids and conjunctivae normal, sclerae anicteric  ENMT: External Ears, Nose appear normal. Hard of Hearing Neck: Appears normal, supple, no cervical masses, normal ROM, no appreciable thyromegaly, no JVD Respiratory: Diminished to auscultation bilaterally, no wheezing, rales, rhonchi or crackles. Normal respiratory effort and patient is not tachypenic. No accessory muscle use.  Cardiovascular: RRR, no murmurs / rubs / gallops. S1 and S2 auscultated. No extremity edema. Abdomen: Soft, non-tender, non-distended. No masses palpated. No appreciable hepatosplenomegaly. Bowel sounds positive x4.  GU: Deferred. Musculoskeletal: No clubbing / cyanosis of digits/nails. Left Leg straightened  Skin: No rashes, lesions, ulcers on a limited skin eval. No induration; Warm and dry. Left Hip Incisions appear C/D/I Neurologic: CN 2-12 grossly intact with no focal deficits. Romberg sign cerebellar reflexes not assessed.  Psychiatric: Normal judgment and insight. Alert and oriented x 3. Normal mood and appropriate affect.   Data Reviewed: I have personally reviewed following labs and imaging studies  CBC: Recent Labs  Lab 05/25/17 0551 05/25/17 2020 05/26/17 0653  WBC 14.4* 17.2* 18.3*  NEUTROABS 12.3*  --   --   HGB 9.1* 8.5* 7.3*  HCT 27.8* 26.1* 23.4*  MCV 98.6 98.9 99.2  PLT 203 204 170   Basic Metabolic Panel: Recent Labs  Lab  05/25/17 0551 05/25/17 2020 05/26/17 0653  NA 142  --  143  K 3.8  --  3.9  CL 106  --  112*  CO2 27  --  23  GLUCOSE 138*  --  127*  BUN 26*  --  32*  CREATININE 1.23* 1.18* 1.44*  CALCIUM 9.2  --  8.4*   GFR: CrCl cannot be calculated (Unknown ideal weight.). Liver Function Tests: Recent Labs  Lab 05/25/17 0551  AST 21  ALT 13*  ALKPHOS 50  BILITOT 0.7  PROT 5.8*  ALBUMIN 3.4*   No results for input(s): LIPASE, AMYLASE in the last 168 hours. No results for input(s): AMMONIA in the last 168 hours. Coagulation Profile: Recent Labs  Lab 05/25/17 0551  INR 1.14   Cardiac Enzymes: No results for input(s): CKTOTAL, CKMB, CKMBINDEX, TROPONINI in the last 168 hours. BNP (last 3 results) No results for input(s): PROBNP in the last 8760 hours. HbA1C: No results for input(s): HGBA1C in the last 72 hours. CBG: No results for input(s): GLUCAP in the last 168 hours. Lipid Profile: No results for input(s): CHOL, HDL, LDLCALC, TRIG, CHOLHDL, LDLDIRECT in the last 72 hours. Thyroid Function Tests: No results for input(s): TSH, T4TOTAL, FREET4, T3FREE, THYROIDAB in the last 72 hours. Anemia Panel: No results for input(s): VITAMINB12,  FOLATE, FERRITIN, TIBC, IRON, RETICCTPCT in the last 72 hours. Sepsis Labs: No results for input(s): PROCALCITON, LATICACIDVEN in the last 168 hours.  Recent Results (from the past 240 hour(s))  MRSA PCR Screening     Status: None   Collection Time: 05/25/17 10:44 AM  Result Value Ref Range Status   MRSA by PCR NEGATIVE NEGATIVE Final    Comment:        The GeneXpert MRSA Assay (FDA approved for NASAL specimens only), is one component of a comprehensive MRSA colonization surveillance program. It is not intended to diagnose MRSA infection nor to guide or monitor treatment for MRSA infections.     Radiology Studies: Pelvis Portable  Result Date: 05/25/2017 CLINICAL DATA:  Left hip fracture fixation. EXAM: DG C-ARM 61-120 MIN;  PORTABLE PELVIS 1-2 VIEWS; LEFT FEMUR 2 VIEWS COMPARISON:  Radiographs, same date. FINDINGS: Multiple intraoperative fluoroscopic spot images and 2 postoperative films demonstrate a long stem gamma nail in place with 2 proximal compression screws. No distal interlocking screws. The fracture is reduced. No complicating features. IMPRESSION: Close reduction and internal fixation of an intertrochanteric fracture of the left hip. Good position and alignment and no complicating features. Electronically Signed   By: Rudie MeyerP.  Gallerani M.D.   On: 05/25/2017 19:19   Dg Pelvis Portable  Result Date: 05/25/2017 CLINICAL DATA:  Pain following fall EXAM: PORTABLE PELVIS 1-2 VIEWS COMPARISON:  None. FINDINGS: There is a comminuted intertrochanteric femur fracture on the left with varus angulation at the fracture site. There is avulsion of the lesser trochanter medially. No other fracture is appreciable. No dislocation. Bones are diffusely osteoporotic. There is moderate narrowing of both hip joints. IMPRESSION: Comminuted intertrochanteric fracture on the left with varus angulation at fracture site. No other fractures are evident. No dislocation. There is symmetric moderate narrowing of the hip joints bilaterally. Bones are diffusely osteoporotic. These results will be called to the ordering clinician or representative by the Radiologist Assistant, and communication documented in the PACS or zVision Dashboard. Electronically Signed   By: Bretta BangWilliam  Woodruff III M.D.   On: 05/25/2017 07:45   Dg Chest Port 1 View  Result Date: 05/25/2017 CLINICAL DATA:  Preoperative evaluation for upcoming left hip surgery EXAM: PORTABLE CHEST 1 VIEW COMPARISON:  09/10/2007 FINDINGS: Cardiac shadow is mildly enlarged but stable. Pacing device is now seen. The lungs are well-aerated without focal infiltrate. Calcification mitral annulus is noted. Old right humeral fracture with healing is seen. No acute bony abnormality is noted. IMPRESSION:  Chronic changes without acute abnormality. Electronically Signed   By: Alcide CleverMark  Lukens M.D.   On: 05/25/2017 09:34   Dg C-arm 1-60 Min  Result Date: 05/25/2017 CLINICAL DATA:  Left hip fracture fixation. EXAM: DG C-ARM 61-120 MIN; PORTABLE PELVIS 1-2 VIEWS; LEFT FEMUR 2 VIEWS COMPARISON:  Radiographs, same date. FINDINGS: Multiple intraoperative fluoroscopic spot images and 2 postoperative films demonstrate a long stem gamma nail in place with 2 proximal compression screws. No distal interlocking screws. The fracture is reduced. No complicating features. IMPRESSION: Close reduction and internal fixation of an intertrochanteric fracture of the left hip. Good position and alignment and no complicating features. Electronically Signed   By: Rudie MeyerP.  Gallerani M.D.   On: 05/25/2017 19:19   Dg Femur Min 2 Views Left  Result Date: 05/25/2017 CLINICAL DATA:  Left hip fracture fixation. EXAM: DG C-ARM 61-120 MIN; PORTABLE PELVIS 1-2 VIEWS; LEFT FEMUR 2 VIEWS COMPARISON:  Radiographs, same date. FINDINGS: Multiple intraoperative fluoroscopic spot images and 2 postoperative films  demonstrate a long stem gamma nail in place with 2 proximal compression screws. No distal interlocking screws. The fracture is reduced. No complicating features. IMPRESSION: Close reduction and internal fixation of an intertrochanteric fracture of the left hip. Good position and alignment and no complicating features. Electronically Signed   By: Rudie Meyer M.D.   On: 05/25/2017 19:19   Scheduled Meds: . cholecalciferol  1,000 Units Oral Daily  . enoxaparin (LOVENOX) injection  40 mg Subcutaneous Q24H  . lisinopril  10 mg Oral Daily   And  . hydrochlorothiazide  6.25 mg Oral Daily  . metoprolol tartrate  25 mg Oral Daily   Continuous Infusions: . sodium chloride 75 mL/hr at 05/25/17 0320  . sodium chloride 125 mL/hr at 05/26/17 0457  . lactated ringers 10 mL/hr at 05/25/17 1524  . methocarbamol (ROBAXIN)  IV Stopped (05/26/17 0144)     LOS: 1 day   Merlene Laughter, DO [facility] Pager [phone]  If 7PM-7AM, please contact night-coverage [email or web address] Password [email or web address] 05/26/2017, 9:07 AM

## 2017-05-26 NOTE — Anesthesia Postprocedure Evaluation (Signed)
Anesthesia Post Note  Patient: Ludivina Guymon Hocutt  Procedure(s) Performed: INTRAMEDULLARY (IM) NAIL INTERTROCHANTRIC (Left Hip)     Patient location during evaluation: PACU Anesthesia Type: MAC and Spinal Level of consciousness: awake and alert Pain management: pain level controlled Vital Signs Assessment: post-procedure vital signs reviewed and stable Respiratory status: spontaneous breathing, nonlabored ventilation, respiratory function stable and patient connected to nasal cannula oxygen Cardiovascular status: stable and blood pressure returned to baseline Postop Assessment: no apparent nausea or vomiting and spinal receding Anesthetic complications: no    Last Vitals:  Vitals:   05/26/17 0322 05/26/17 0630  BP: 96/71 111/89  Pulse: 64 68  Resp: 18   Temp: (!) 36.4 C 36.4 C  SpO2: 100% 100%    Last Pain:  Vitals:   05/26/17 0630  TempSrc: Axillary  PainSc:                  Markeith Jue

## 2017-05-26 NOTE — Evaluation (Signed)
Physical Therapy Evaluation Patient Details Name: Charlotte Huber MRN: [id number] DOB: 10/06/1918 Today's Date: 05/26/2017   History of Present Illness  83 y.o. female with medical history significant for HTN, dementia, AICD, osteoporosis who [facility] after an unwitnessed fall. Now s/p INTRAMEDULLARY (IM) NAIL INTERTROCHANTRIC (Left Hip).  Clinical Impression  Pt admitted with above diagnosis. Pt currently with functional limitations due to the deficits listed below (see PT Problem List). At the time of PT eval pt was able to perform transfers with up to +2 total assist for all aspects of mobility. Discussed current level of function with family at end of session, and they agree that SNF is the best option to maximize functional independence and safety at this time. Acutely, pt will benefit from skilled PT to increase their independence and safety with mobility to allow discharge to the venue listed below.       Follow Up Recommendations SNF;Supervision/Assistance - 24 hour    Equipment Recommendations  None recommended by PT(TBD by next venue of care)    Recommendations for Other Services       Precautions / Restrictions Precautions Precautions: Fall Restrictions Weight Bearing Restrictions: Yes LLE Weight Bearing: Weight bearing as tolerated      Mobility  Bed Mobility Overal bed mobility: Needs Assistance Bed Mobility: Supine to Sit     Supine to sit: Total assist;+2 for physical assistance     General bed mobility comments: With use of bed pad and helicopter technique. When sitting EOB pt noted to keep R knee fully extended.  Transfers Overall transfer level: Needs assistance Equipment used: 2 person hand held assist Transfers: Sit to/from [employer] CorporationStand;Stand Pivot Transfers Sit to Stand: Total assist;+2 physical assistance Stand pivot transfers: Total assist;+2 physical assistance       General transfer comment: Total assist +2 with gait belt and bed pad  for sit to stand and stand pivot toward R side. Pt noted to keep R foot off ground during transfer.  Ambulation/Gait                Stairs            Wheelchair Mobility    Modified Rankin (Stroke Patients Only)       Balance Overall balance assessment: Needs assistance Sitting-balance support: Feet supported;Bilateral upper extremity supported Sitting balance-Leahy Scale: Poor Sitting balance - Comments: Posterior and R lateral lean in sitting. Postural control: Posterior lean;Right lateral lean Standing balance support: Bilateral upper extremity supported Standing balance-Leahy Scale: Zero Standing balance comment: Total assist +2 for standing balance.                             Pertinent Vitals/Pain Pain Assessment: Faces Faces Pain Scale: Hurts little more Pain Location: L hip Pain Descriptors / Indicators: Grimacing Pain Intervention(s): Limited activity within patient's tolerance;Monitored during session;Repositioned    Home Living Family/patient expects to be discharged to:: Skilled nursing facility Living Arrangements: Children Available Help at Discharge: Family;Available 24 hours/day Type of Home: House         Home Equipment: Gilmer MorCane - single point      Prior Function Level of Independence: Needs assistance   Gait / Transfers Assistance Needed: furniture walking  ADL's / Homemaking Assistance Needed: family assisting with picking out clothing but pt able to physically complete BADL without assist        Hand Dominance        Extremity/Trunk Assessment  Upper Extremity Assessment Upper Extremity Assessment: Generalized weakness    Lower Extremity Assessment Lower Extremity Assessment: Generalized weakness;LLE deficits/detail;RLE deficits/detail RLE Deficits / Details: Pt holding RLE up off of floor while sitting EOB and during transfer to chair. Resisted therapist when attempted to flex the knee passively, however was  able to bend it.  LLE Deficits / Details: Acute pain, decreased strength and AROM consistent with above mentioned procedure.     Cervical / Trunk Assessment Cervical / Trunk Assessment: Kyphotic;Other exceptions Cervical / Trunk Exceptions: Scoliosis  Communication   Communication: HOH(L ear better than R)  Cognition Arousal/Alertness: Awake/alert Behavior During Therapy: Flat affect Overall Cognitive Status: No family/caregiver present to determine baseline cognitive functioning                                 General Comments: Pt is very HOH; difficult to differentiate cognitive deficits vs unable to hear questions/commands. Pt with baseline dementia per chart.      General Comments      Exercises     Assessment/Plan    PT Assessment Patient needs continued PT services  PT Problem List Decreased strength;Decreased range of motion;Decreased activity tolerance;Decreased balance;Decreased mobility;Decreased knowledge of use of DME;Decreased safety awareness;Decreased knowledge of precautions;Decreased cognition;Pain       PT Treatment Interventions DME instruction;Gait training;Stair training;Functional mobility training;Therapeutic activities;Therapeutic exercise;Neuromuscular re-education;Patient/family education;Cognitive remediation    PT Goals (Current goals can be found in the Care Plan section)  Acute Rehab PT Goals Patient Stated Goal: none stated PT Goal Formulation: Patient unable to participate in goal setting Time For Goal Achievement: 06/09/17 Potential to Achieve Goals: Good    Frequency Min 3X/week   Barriers to discharge        Co-evaluation PT/OT/SLP Co-Evaluation/Treatment: Yes Reason for Co-Treatment: To address functional/ADL transfers;For patient/therapist safety;Necessary to address cognition/behavior during functional activity PT goals addressed during session: Mobility/safety with mobility;Balance OT goals addressed during session:  Other (comment)(functional mobility)       AM-PAC PT "6 Clicks" Daily Activity  Outcome Measure Difficulty turning over in bed (including adjusting bedclothes, sheets and blankets)?: Unable Difficulty moving from lying on back to sitting on the side of the bed? : Unable Difficulty sitting down on and standing up from a chair with arms (e.g., wheelchair, bedside commode, etc,.)?: Unable Help needed moving to and from a bed to chair (including a wheelchair)?: Total Help needed walking in hospital room?: Total Help needed climbing 3-5 steps with a railing? : Total 6 Click Score: 6    End of Session Equipment Utilized During Treatment: Gait belt;Oxygen Activity Tolerance: Patient tolerated treatment well Patient left: in chair;with call bell/phone within reach;with chair alarm set Nurse Communication: Mobility status PT Visit Diagnosis: Pain;Difficulty in walking, not elsewhere classified (R26.2);History of falling (Z91.81) Pain - part of body: Hip;Leg    Time: [id number] PT Time Calculation (min) (ACUTE ONLY): 16 min   Charges:   PT Evaluation $PT Eval Moderate Complexity: 1 Mod     PT G Codes:        Conni SlipperLaura Oval Cavazos, PT, DPT Acute Rehabilitation Services Pager: [phone]   Marylynn PearsonLaura D Mica Ramdass 05/26/2017, 10:54 AM

## 2017-05-26 NOTE — Progress Notes (Signed)
Patient has an Adapta DR Pacemaker implanted on 05/10/2009 Serial#    Model# [id number]   ADDR01 [id number]   [id number]   [id number]  Call Rosalee KaufmanSyed Ahmed, MD [phone] for medical questions or emergencies

## 2017-05-26 NOTE — [place] FL2 (Signed)
[place] MEDICAID FL2 LEVEL OF CARE SCREENING TOOL     IDENTIFICATION  Patient Name: Rudean Haskellheo W Glanzer Birthdate: 10/25/1918 Sex: female Admission Date (Current Location): 05/25/2017  [facility] [place]  Recruitment [place]   Facility and Address:  [facility], [address], [place], [place] [address]      Provider Number: [id number]  Attending Physician Name and Address:  Merlene LaughterSheikh, Omair Latif, DO  Relative Name and Phone Number:  Crist FatFern, [phone]    Current Level of Care: Hospital Recommended Level of Care: Skilled Nursing Facility Prior Approval Number:    Date Approved/Denied:   PASRR Number: [id number]  Discharge Plan: SNF    Current Diagnoses: Patient Active Problem List   Diagnosis Date Noted  . Closed left hip fracture (HCC) 05/25/2017    Orientation RESPIRATION BLADDER Height & Weight     Self  O2(Nasal cannula 2L) Incontinent Weight: 52 kg (114 lb 10.2 oz) Height:     BEHAVIORAL SYMPTOMS/MOOD NEUROLOGICAL BOWEL NUTRITION STATUS      Continent Diet(Please see DC Summary)  AMBULATORY STATUS COMMUNICATION OF NEEDS Skin   Extensive Assist Verbally Surgical wounds(Closed incision on hip)                       Personal Care Assistance Level of Assistance  Bathing, Feeding, Dressing Bathing Assistance: Maximum assistance Feeding assistance: Limited assistance Dressing Assistance: Limited assistance     Functional Limitations Info             SPECIAL CARE FACTORS FREQUENCY  PT (By licensed PT), OT (By licensed OT)     PT Frequency: 5x/week OT Frequency: 3x/week            Contractures      Additional Factors Info  Code Status, Allergies Code Status Info: DNR Allergies Info: NKA           Current Medications (05/26/2017):  This is the current hospital active medication list Current Facility-Administered Medications  Medication Dose Route Frequency Provider Last Rate Last Dose  . 0.9 %  sodium chloride  infusion   Intravenous Continuous Lorretta HarpNiu, Xilin, MD 75 mL/hr at 05/25/17 0320    . 0.9 %  sodium chloride infusion   Intravenous Continuous Tarry KosXu, Naiping M, MD 125 mL/hr at 05/26/17 0457    . acetaminophen (TYLENOL) tablet 650 mg  650 mg Oral Q6H PRN Tarry KosXu, Naiping M, MD   650 mg at 05/26/17 [id number]   Or  . acetaminophen (TYLENOL) suppository 650 mg  650 mg Rectal Q6H PRN Tarry KosXu, Naiping M, MD      . acetaminophen (TYLENOL) tablet 650 mg  650 mg Oral Q6H PRN Lorretta HarpNiu, Xilin, MD      . alum & mag hydroxide-simeth (MAALOX/MYLANTA) 200-200-20 MG/5ML suspension 30 mL  30 mL Oral Q4H PRN Tarry KosXu, Naiping M, MD      . cholecalciferol (VITAMIN D) tablet 1,000 Units  1,000 Units Oral Daily Tarry KosXu, Naiping M, MD   1,000 Units at 05/26/17 [phone]  . enoxaparin (LOVENOX) injection 40 mg  40 mg Subcutaneous Q24H Tarry KosXu, Naiping M, MD      . hydrALAZINE (APRESOLINE) injection 5 mg  5 mg Intravenous Q2H PRN Lorretta HarpNiu, Xilin, MD      . lisinopril (PRINIVIL,ZESTRIL) tablet 10 mg  10 mg Oral Daily Dow AdolphHall, Carole N, DO   10 mg at 05/26/17 [id number]   And  . hydrochlorothiazide 10 mg/mL oral suspension 6.25 mg  6.25 mg Oral Daily Darlin DropHall, Carole N,  DO   6.25 mg at 05/26/17 1015  . HYDROcodone-acetaminophen (NORCO/VICODIN) 5-325 MG per tablet 1-2 tablet  1-2 tablet Oral Q6H PRN Tarry KosXu, Naiping M, MD   2 tablet at 05/26/17 1307  . lactated ringers infusion   Intravenous Continuous Val EagleMoser, Christopher, MD 10 mL/hr at 05/25/17 1524    . menthol-cetylpyridinium (CEPACOL) lozenge 3 mg  1 lozenge Oral PRN Tarry KosXu, Naiping M, MD       Or  . phenol (CHLORASEPTIC) mouth spray 1 spray  1 spray Mouth/Throat PRN Tarry KosXu, Naiping M, MD      . methocarbamol (ROBAXIN) tablet 500 mg  500 mg Oral Q6H PRN Tarry KosXu, Naiping M, MD       Or  . methocarbamol (ROBAXIN) 500 mg in dextrose 5 % 50 mL IVPB  500 mg Intravenous Q6H PRN Tarry KosXu, Naiping M, MD   Stopped at 05/26/17 0144  . metoCLOPramide (REGLAN) tablet 5-10 mg  5-10 mg Oral Q8H PRN Tarry KosXu, Naiping M, MD       Or  . metoCLOPramide (REGLAN) injection 5-10 mg   5-10 mg Intravenous Q8H PRN Tarry KosXu, Naiping M, MD      . metoprolol tartrate (LOPRESSOR) tablet 25 mg  25 mg Oral Daily Tarry KosXu, Naiping M, MD   25 mg at 05/26/17 0835  . morphine 4 MG/ML injection 0.52 mg  0.52 mg Intravenous Q2H PRN Tarry KosXu, Naiping M, MD   0.52 mg at 05/25/17 2119  . ondansetron (ZOFRAN) tablet 4 mg  4 mg Oral Q6H PRN Tarry KosXu, Naiping M, MD       Or  . ondansetron [facility](ZOFRAN) injection 4 mg  4 mg Intravenous Q6H PRN Tarry KosXu, Naiping M, MD   4 mg at 05/25/17 2120  . oxyCODONE (Oxy IR/ROXICODONE) immediate release tablet 5-10 mg  5-10 mg Oral Q4H PRN Tarry KosXu, Naiping M, MD   10 mg at 05/26/17 0631  . zolpidem (AMBIEN) tablet 5 mg  5 mg Oral QHS PRN Lorretta HarpNiu, Xilin, MD   5 mg at 05/25/17 2143     Discharge Medications: Please see discharge summary for a list of discharge medications.  Relevant Imaging Results:  Relevant Lab Results:   Additional Information SSN: [id number]  Seve Monette S Lind[place], [place]

## 2017-05-26 NOTE — Evaluation (Signed)
Occupational Therapy Evaluation and Discharge Patient Details Name: EMAAN Huber MRN: [id number] DOB: 05/15/1919 Today's Date: 05/26/2017    History of Present Illness 83 y.o. female with medical history significant for HTN, dementia, AICD, osteoporosis who presented at [facility] from home after an unwitnessed fall. Now s/p INTRAMEDULLARY (IM) NAIL INTERTROCHANTRIC (Left Hip).   Clinical Impression   Per family, pt independent with BADL and furniture walked for mobility PTA. Currently pt overall total assist +2 for stand pivot transfer and ADL. Recommending SNF for follow up to maximize independence and safety with ADL and functional mobility prior to return home with family assist. All further OT needs can be met at the next venue of care--will defer OT to SNF. Please re-consult if needs change. Thank you for this referral.    Follow Up Recommendations  SNF;Supervision/Assistance - 24 hour    Equipment Recommendations  Other (comment)(TBD at next venue)    Recommendations for Other Services       Precautions / Restrictions Precautions Precautions: Fall Restrictions Weight Bearing Restrictions: Yes LLE Weight Bearing: Weight bearing as tolerated      Mobility Bed Mobility Overal bed mobility: Needs Assistance Bed Mobility: Supine to Sit     Supine to sit: Total assist;+2 for physical assistance     General bed mobility comments: With use of bed pad and helicopter technique. When sitting EOB pt noted to keep R knee fully extended.  Transfers Overall transfer level: Needs assistance Equipment used: 2 person hand held assist Transfers: Sit to/from [facility] Sit to Stand: Total assist;+2 physical assistance Stand pivot transfers: Total assist;+2 physical assistance       General transfer comment: Total assist +2 with gait belt and bed pad for sit to stand and stand pivot toward R side. Pt noted to keep R foot off ground during transfer.    Balance Overall  balance assessment: Needs assistance Sitting-balance support: Feet supported;Bilateral upper extremity supported Sitting balance-Leahy Scale: Poor Sitting balance - Comments: Posterior and R lateral lean in sitting. Postural control: Posterior lean;Right lateral lean Standing balance support: Bilateral upper extremity supported Standing balance-Leahy Scale: Zero Standing balance comment: Total assist +2 for standing balance.                           ADL either performed or assessed with clinical judgement   ADL Overall ADL's : Needs assistance/impaired                                       General ADL Comments: Pt currently total assist for ADL.     Vision         Perception     Praxis      Pertinent Vitals/Pain Pain Assessment: Faces Faces Pain Scale: Hurts little more Pain Location: L hip Pain Descriptors / Indicators: Grimacing Pain Intervention(s): Monitored during session;Repositioned     Hand Dominance     Extremity/Trunk Assessment Upper Extremity Assessment Upper Extremity Assessment: Generalized weakness   Lower Extremity Assessment Lower Extremity Assessment: Defer to PT evaluation   Cervical / Trunk Assessment Cervical / Trunk Assessment: Kyphotic   Communication Communication Communication: HOH(L ear better than R)   Cognition Arousal/Alertness: Awake/alert Behavior During Therapy: Flat affect Overall Cognitive Status: No family/caregiver present to determine baseline cognitive functioning  General Comments: Pt is very HOH; difficult to differentiate cognitive deficits vs unable to hear questions/commands. Pt with baseline dementia per chart.   General Comments       Exercises     Shoulder Instructions      Home Living Family/patient expects to be discharged to:: Skilled nursing facility Living Arrangements: Children Available Help at Discharge: Family;Available 24  hours/day Type of Home: House                       Home Equipment: Cane - single point          Prior Functioning/Environment Level of Independence: Needs assistance  Gait / Transfers Assistance Needed: furniture walking ADL's / Homemaking Assistance Needed: family assisting with picking out clothing but pt able to physically complete BADL without assist            OT Problem List:        OT Treatment/Interventions:      OT Goals(Current goals can be found in the care plan section) Acute Rehab OT Goals Patient Stated Goal: none stated OT Goal Formulation: With patient Time For Goal Achievement: 06/09/17 Potential to Achieve Goals: Fair  OT Frequency:     Barriers to D/C:            Co-evaluation PT/OT/SLP Co-Evaluation/Treatment: Yes Reason for Co-Treatment: For patient/therapist safety;To address functional/ADL transfers   OT goals addressed during session: Other (comment)(functional mobility)      AM-PAC PT "6 Clicks" Daily Activity     Outcome Measure Help from another person eating meals?: Total Help from another person taking care of personal grooming?: Total Help from another person toileting, which includes using toliet, bedpan, or urinal?: Total Help from another person bathing (including washing, rinsing, drying)?: Total Help from another person to put on and taking off regular upper body clothing?: Total Help from another person to put on and taking off regular lower body clothing?: Total 6 Click Score: 6   End of Session Equipment Utilized During Treatment: Gait belt;Oxygen  Activity Tolerance: Patient tolerated treatment well Patient left: in chair;with call bell/phone within reach;with chair alarm set  OT Visit Diagnosis: Unsteadiness on feet (R26.81);Other abnormalities of gait and mobility (R26.89);Pain Pain - Right/Left: Left Pain - part of body: Hip                Time: 7847-8412 OT Time Calculation (min): 18 min Charges:  OT  General Charges $OT Visit: 1 Visit OT Evaluation $OT Eval Moderate Complexity: 1 Mod G-Codes:     Charlotte Huber A. Ulice Brilliant, M.S., OTR/L Pager: [phone] 05/26/2017, 10:15 AM

## 2017-05-26 NOTE — Progress Notes (Signed)
   Subjective:  Patient is sleeping. No events  Objective:   VITALS:   Vitals:   05/25/17 1940 05/25/17 2000 05/26/17 0322 05/26/17 0630  BP: 120/80 102/83 96/71 111/89  Pulse: (!) 115 (!) 105 64 68  Resp: 20 18 18    Temp:  97.6 F (36.4 C) (!) 97.5 F (36.4 C) 97.6 F (36.4 C)  TempSrc:   Axillary Axillary  SpO2: 100% 98% 100% 100%  Weight:        Neurovascular intact Sensation intact distally Intact pulses distally Dorsiflexion/Plantar flexion intact Incision: dressing C/D/I and no drainage No cellulitis present Compartment soft   Lab Results  Component Value Date   WBC 17.2 (H) 05/25/2017   HGB 8.5 (L) 05/25/2017   HCT 26.1 (L) 05/25/2017   MCV 98.9 05/25/2017   PLT 204 05/25/2017     Assessment/Plan:  1 Day Post-Op   - Expected postop acute blood loss anemia - will monitor for symptoms - Up with PT/OT - DVT ppx - SCDs, ambulation, lovenox - WBAT operative extremity - Pain control - Discharge planning - will need SNF  Glee ArvinMichael Kai Calico 05/26/2017, 7:43 AM [phone]

## 2017-05-26 NOTE — Progress Notes (Signed)
Orthopedic Tech Progress Note Patient Details:  Joselyn Arrowheo W Fredman 06/27/1918 [id number]  Patient ID: Rudean Haskellheo W Prell, female   DOB: 11/05/1918, 83 y.o.   MRN: [id number] Pt cant have ohf due to age restrictions  Trinna PostMartinez, Madox Corkins J 05/26/2017, 7:19 PM

## 2017-05-27 ENCOUNTER — Inpatient Hospital Stay (HOSPITAL_COMMUNITY): Payer: Medicare Other

## 2017-05-27 DIAGNOSIS — D649 Anemia, unspecified: Secondary | ICD-10-CM

## 2017-05-27 DIAGNOSIS — M25551 Pain in right hip: Secondary | ICD-10-CM

## 2017-05-27 DIAGNOSIS — N179 Acute kidney failure, unspecified: Secondary | ICD-10-CM

## 2017-05-27 DIAGNOSIS — I10 Essential (primary) hypertension: Secondary | ICD-10-CM

## 2017-05-27 DIAGNOSIS — R410 Disorientation, unspecified: Secondary | ICD-10-CM

## 2017-05-27 DIAGNOSIS — E46 Unspecified protein-calorie malnutrition: Secondary | ICD-10-CM

## 2017-05-27 DIAGNOSIS — M810 Age-related osteoporosis without current pathological fracture: Secondary | ICD-10-CM

## 2017-05-27 DIAGNOSIS — D62 Acute posthemorrhagic anemia: Secondary | ICD-10-CM

## 2017-05-27 DIAGNOSIS — N189 Chronic kidney disease, unspecified: Secondary | ICD-10-CM

## 2017-05-27 DIAGNOSIS — D72829 Elevated white blood cell count, unspecified: Secondary | ICD-10-CM

## 2017-05-27 DIAGNOSIS — R296 Repeated falls: Secondary | ICD-10-CM

## 2017-05-27 LAB — MAGNESIUM: Magnesium: 1.5 mg/dL — ABNORMAL LOW (ref 1.7–2.4)

## 2017-05-27 LAB — COMPREHENSIVE METABOLIC PANEL
ALBUMIN: 2.9 g/dL — AB (ref 3.5–5.0)
ALK PHOS: 42 U/L (ref 38–126)
ALT: 9 U/L — ABNORMAL LOW (ref 14–54)
ANION GAP: 9 (ref 5–15)
AST: 121 U/L — ABNORMAL HIGH (ref 15–41)
BUN: 38 mg/dL — ABNORMAL HIGH (ref 6–20)
CALCIUM: 8.5 mg/dL — AB (ref 8.9–10.3)
CO2: 22 mmol/L (ref 22–32)
Chloride: 113 mmol/L — ABNORMAL HIGH (ref 101–111)
Creatinine, Ser: 1.64 mg/dL — ABNORMAL HIGH (ref 0.44–1.00)
GFR calc Af Amer: 29 mL/min — ABNORMAL LOW (ref >60)
GFR calc non Af Amer: 25 mL/min — ABNORMAL LOW (ref >60)
GLUCOSE: 110 mg/dL — AB (ref 65–99)
POTASSIUM: 3.8 mmol/L (ref 3.5–5.1)
SODIUM: 144 mmol/L (ref 135–145)
Total Bilirubin: 0.7 mg/dL (ref 0.3–1.2)
Total Protein: 5.3 g/dL — ABNORMAL LOW (ref 6.5–8.1)

## 2017-05-27 LAB — CBC WITH DIFFERENTIAL/PLATELET
BASOS PCT: 0 %
Basophils Absolute: 0 10*3/uL (ref 0.0–0.1)
EOS ABS: 0 10*3/uL (ref 0.0–0.7)
Eosinophils Relative: 0 %
HCT: 21.6 % — ABNORMAL LOW (ref 36.0–46.0)
HEMOGLOBIN: 7 g/dL — AB (ref 12.0–15.0)
Lymphocytes Relative: 9 %
Lymphs Abs: 1.3 10*3/uL (ref 0.7–4.0)
MCH: 32.4 pg (ref 26.0–34.0)
MCHC: 32.4 g/dL (ref 30.0–36.0)
MCV: 100 fL (ref 78.0–100.0)
MONOS PCT: 15 %
Monocytes Absolute: 2.2 10*3/uL — ABNORMAL HIGH (ref 0.1–1.0)
NEUTROS PCT: 76 %
Neutro Abs: 11.6 10*3/uL — ABNORMAL HIGH (ref 1.7–7.7)
Platelets: 154 10*3/uL (ref 150–400)
RBC: 2.16 MIL/uL — ABNORMAL LOW (ref 3.87–5.11)
RDW: 14.2 % (ref 11.5–15.5)
WBC: 15.1 10*3/uL — AB (ref 4.0–10.5)

## 2017-05-27 LAB — PREPARE RBC (CROSSMATCH)

## 2017-05-27 LAB — PHOSPHORUS: Phosphorus: 3.4 mg/dL (ref 2.5–4.6)

## 2017-05-27 MED ORDER — MAGNESIUM SULFATE 2 GM/50ML IV SOLN
2.0000 g | Freq: Once | INTRAVENOUS | Status: AC
  Administered 2017-05-27: 2 g via INTRAVENOUS
  Filled 2017-05-27: qty 50

## 2017-05-27 MED ORDER — SODIUM CHLORIDE 0.9 % IV SOLN
Freq: Once | INTRAVENOUS | Status: AC
  Administered 2017-05-27: 11:00:00 via INTRAVENOUS

## 2017-05-27 NOTE — Progress Notes (Signed)
   Subjective:  Patient is sleeping. No events  Objective:   VITALS:   Vitals:   05/26/17 0630 05/26/17 1347 05/26/17 2138 05/27/17 0400  BP: 111/89 (!) 111/46 (!) 96/54 (!) 103/57  Pulse: 68 68 (!) 109 (!) 115  Resp:      Temp: 97.6 F (36.4 C) 98 F (36.7 C) 98.6 F (37 C)   TempSrc: Axillary Oral Axillary   SpO2: 100% 99% 100%   Weight:        Neurovascular intact Sensation intact distally Intact pulses distally Dorsiflexion/Plantar flexion intact Incision: dressing C/D/I and no drainage No cellulitis present Compartment soft   Lab Results  Component Value Date   WBC 15.1 (H) 05/27/2017   HGB 7.0 (L) 05/27/2017   HCT 21.6 (L) 05/27/2017   MCV 100.0 05/27/2017   PLT 154 05/27/2017     Assessment/Plan:  2 Days Post-Op   - ABLA - consider transfusion - right hip CT scan to r/o fx  Glee ArvinMichael Xu 05/27/2017, 9:31 AM [phone]

## 2017-05-27 NOTE — Progress Notes (Signed)
PROGRESS NOTE    Charlotte Huber  WUJ:[id number] DOB: 10/25/1918 DOA: 05/25/2017 PCP: Patient, No Pcp Per   Brief Narrative:  Charlotte Huber is a 83 y.o. female with medical history significant for HTN, dementia, AICD, osteoporosis who presented at [facility] from home after an unwitnessed fall that occurred yesterday around 1700. Patient had bowel incontinence earlier that day. Prior to that was in her usual state of health. In the ED a pelvis portable xray revealed: Comminuted intertrochanteric fracture on the left with varus angulation at fracture site. No other fractures are evident. No dislocation. There is symmetric moderate narrowing of the hip joints bilaterally. Bones are diffusely osteoporotic. Orthopedic surgery consulted and patient underwent surgical repair this afternoon 05/25/17. She has been intermittently agitated and Cr is elevated so Lisinopril-HCTZ discontinued. Blood Count dropped so she was transfused 1 unit of pRBC's.   Assessment & Plan:   Active Problems:   Closed left hip fracture (HCC)  Acute Left comminuted intertrochanteric fracture post presumed mechanical fall, poA s/p Surgical Treatment of the Intertrochanteric Fx  POD 2 -Ortho following and appreciate additional recc's -SCDs -Pain management with Hydrocodone-Acetaminophen 1-2 tab po q6hprn Moderate Pain along with Oxycodone 5-10 mg po q4hprn (for Breakthrough Moderate Pain) and IV Morphine 1 mg IV q2hprn for Sever Pain -C/w Gentle IVF with NS at 75 mL/hr  Unwitnessed Fall -Checked Head CT No acute intracranial abnormality. Mild to moderate cerebral atrophy and chronic small vessel disease. 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  -Cr 1.23, bun 26, GFR 36 on  admission -BUN/Cr worsening and went from 26/1.23 -> 32/1.44 -> 38/1.64 -D/C'd Lisinopril-HCTZ -C/w gentle IV fluid hydration as above -Also giving 1 unit of pRBC's -If continues to worsen will need to discuss with Nephrology  -Repeat CMP in AM   Normocytic Anemia as well as ABLA -No baseline to compare -Hg 9.1 on  Admission  -Post-Operative Drop as Hb/Hct went from 9.1/27.8 -> 8.5/26.1 -> 7.3/23.4 -> 7.0/21.6 -Will transfuse 1 unit of pRBC's  -Continue to Transfuse if Hb drops 7.0 or below -Repeat CBC in AM   Moderate Protein Calorie Malnutrition -Nutritionist Consulted   Hypomagnesemia -Patient's Mag Level was 1.5 this AM -Replete with IV Mag Sulfate 2 grams -Continue to Monitor and Replete as Necessary -Repeat Mag Level in AM   HTN -D/C'd Lisinopril/HCTZ 10-6.25 mg po Daily -C/w Metoprolol 25 mg po Daily  -c/w Hydralazine 5 mg IV q2hprn for SBP >175  Leukocytosis  -Most likely reactive from Pain and Post-Operative -WBC 14k on admission and worsened to 18.3 but now slightly improved to 15.1 -No sign of active infective process -Continue to Monitor and Repeat CBC in AM  Reported Recent Bowel incontinence -Will continue monitor -No recurrence since in the hospital per nursing   Acute Delirium/Agitation in the Setting of Dementia -Likely from Pain -Deliruim Precautions  -C/w Pain Control with Tylenol for Mild Pain Hydrocodone-Acetaminophen 1-2 tab po q6hprn Moderate Pain along with Oxycodone 5-10 mg po q4hprn (for Breakthrough Moderate Pain) and IV Morphine 1 mg IV q2hprn for Severe Pain -Patient extremely agitated and tried to remove foley yesterday; This AM IV Came out and she ended up with a skin tear   Right Hip Pain -Patient unable to verbalize where pain but  keeps grabbing at Right Hip -Orthopedic Surgery ordered a CT of Right Hip and it showed No definite acute fracture of the right hip. Large, well corticated ossific density along the posterosuperior margin  of the right hip joint may be related to prior trauma or heterotopic ossification. If occult hip fracture is suspected or if the patient is unable to bear weight, consider MRI for further evaluation. Moderate right hip osteoarthritis -Will defer to Orthopedics to obtain Hip MRI -C/w PT/OT Eval and Treat  DVT prophylaxis: C/w Lovenox 30 mg sq q24h Code Status: DO NOT RESUSCITATE  Family Communication: Discussed with family at bedside  Disposition Plan: SNF when medically stable for D/C  Consultants:   Orthopedic Surgery  Procedures: Treatment of intertrochanteric fracture with intramedullary implant done by Dr. Roda ShuttersXu POD2    Antimicrobials:  Anti-infectives (From admission, onward)   Start     Dose/Rate Route Frequency Ordered Stop   05/25/17 2015  ceFAZolin (ANCEF) IVPB 2g/100 mL premix     2 g 200 mL/hr over 30 Minutes Intravenous Every 6 hours 05/25/17 2003 05/26/17 0905   05/25/17 1045  ceFAZolin (ANCEF) IVPB 2g/100 mL premix     2 g 200 mL/hr over 30 Minutes Intravenous On call to O.R. 05/25/17 1043 05/25/17 1646     Subjective: Seen and examined and was not able to provide a subjective hx and was extremely hard of hearing. She stated she was in pain but could not verbalize where. Kept grabbing Right hip. Nursing states she was agitated but not as much as yesterday.   Objective: Vitals:   05/27/17 1224 05/27/17 1434 05/27/17 1515 05/27/17 1741  BP: (!) 120/59 (!) 132/51 (!) 114/58 (!) 109/56  Pulse: (!) 101 84 87 80  Resp:  16 16 16   Temp: 98.3 F (36.8 C) 98.6 F (37 C) 98.6 F (37 C) 99 F (37.2 C)  TempSrc: Axillary Axillary Axillary Axillary  SpO2: 95% 100% 100% 100%  Weight:        Intake/Output Summary (Last 24 hours) at 05/27/2017 1933 Last data filed at 05/27/2017 1741 Gross per 24 hour  Intake 2700 ml  Output -  Net 2700 ml   Filed Weights   05/25/17 0300  Weight: 52 kg (114 lb 10.2 oz)   Examination: Physical Exam:  Constitutional: Thin frail  cachetic appearing female who is demented and confused and unable to provide a subjective history Eyes: Sclerae anicteric. Lids normal ENMT: External Ears and nose appear normal Neck: No JVD; Supple Respiratory: Diminished to auscultation; No appreciable wheezing/rales Cardiovascular: RRR; No appreciable m/r/g Abdomen: Soft, NT, ND, Bowel sounds present GU: Deferred Musculoskeletal: No contractures but patient would lift Right Leg and would not straighten it  Skin: Had a skin tear on Right arm. Right Leg bruising Neurologic: Extremely hard of hearing. No appreciable focal deficits Psychiatric: Impaired judgement and insight; Awake and slightly agitated  Data Reviewed: I have personally reviewed following labs and imaging studies  CBC: Recent Labs  Lab 05/25/17 0551 05/25/17 2020 05/26/17 0653 05/27/17 0427  WBC 14.4* 17.2* 18.3* 15.1*  NEUTROABS 12.3*  --   --  11.6*  HGB 9.1* 8.5* 7.3* 7.0*  HCT 27.8* 26.1* 23.4* 21.6*  MCV 98.6 98.9 99.2 100.0  PLT 203 204 170 154   Basic Metabolic Panel: Recent Labs  Lab 05/25/17 0551 05/25/17 2020 05/26/17 0653 05/27/17 0427  NA 142  --  143 144  K 3.8  --  3.9 3.8  CL 106  --  112*  113*  CO2 27  --  23 22  GLUCOSE 138*  --  127* 110*  BUN 26*  --  32* 38*  CREATININE 1.23* 1.18* 1.44* 1.64*  CALCIUM 9.2  --  8.4* 8.5*  MG  --   --   --  1.5*  PHOS  --   --   --  3.4   GFR: CrCl cannot be calculated (Unknown ideal weight.). Liver Function Tests: Recent Labs  Lab 05/25/17 0551 05/27/17 0427  AST 21 121*  ALT 13* 9*  ALKPHOS 50 42  BILITOT 0.7 0.7  PROT 5.8* 5.3*  ALBUMIN 3.4* 2.9*   No results for input(s): LIPASE, AMYLASE in the last 168 hours. No results for input(s): AMMONIA in the last 168 hours. Coagulation Profile: Recent Labs  Lab 05/25/17 0551  INR 1.14   Cardiac Enzymes: No results for input(s): CKTOTAL, CKMB, CKMBINDEX, TROPONINI in the last 168 hours. BNP (last 3 results) No results for input(s):  PROBNP in the last 8760 hours. HbA1C: No results for input(s): HGBA1C in the last 72 hours. CBG: No results for input(s): GLUCAP in the last 168 hours. Lipid Profile: No results for input(s): CHOL, HDL, LDLCALC, TRIG, CHOLHDL, LDLDIRECT in the last 72 hours. Thyroid Function Tests: No results for input(s): TSH, T4TOTAL, FREET4, T3FREE, THYROIDAB in the last 72 hours. Anemia Panel: No results for input(s): VITAMINB12, FOLATE, FERRITIN, TIBC, IRON, RETICCTPCT in the last 72 hours. Sepsis Labs: No results for input(s): PROCALCITON, LATICACIDVEN in the last 168 hours.  Recent Results (from the past 240 hour(s))  MRSA PCR Screening     Status: None   Collection Time: 05/25/17 10:44 AM  Result Value Ref Range Status   MRSA by PCR NEGATIVE NEGATIVE Final    Comment:        The GeneXpert MRSA Assay (FDA approved for NASAL specimens only), is one component of a comprehensive MRSA colonization surveillance program. It is not intended to diagnose MRSA infection nor to guide or monitor treatment for MRSA infections.     Radiology Studies: Ct Head Wo Contrast  Result Date: 05/26/2017 CLINICAL DATA:  Blunt head trauma.  Headache.  Initial encounter. EXAM: CT HEAD WITHOUT CONTRAST TECHNIQUE: Contiguous axial images were obtained from the base of the skull through the vertex without intravenous contrast. COMPARISON:  None. FINDINGS: Technically suboptimal due to motion artifact. Brain: No evidence of acute infarction, hemorrhage, hydrocephalus, extra-axial collection, or mass lesion/mass effect. Mild to moderate diffuse cerebral atrophy and chronic small vessel disease. Vascular:  No hyperdense vessel or other acute findings. Skull: No evidence of fracture or other significant bone abnormality. Sinuses/Orbits:  No acute findings. Other: None. IMPRESSION: No acute intracranial abnormality. Mild to moderate cerebral atrophy and chronic small vessel disease. Electronically Signed   By: Myles Rosenthal  M.D.   On: 05/26/2017 15:27   Ct Hip Right Wo Contrast  Result Date: 05/27/2017 CLINICAL DATA:  Recent fall with left hip intertrochanteric fracture status post ORIF, now with worsening right hip pain and concern for fracture. EXAM: CT OF THE RIGHT HIP WITHOUT CONTRAST TECHNIQUE: Multidetector CT imaging of the right hip was performed according to the standard protocol. Multiplanar CT image reconstructions were also generated. COMPARISON:  Pelvic x-rays dated May 25, 2017. FINDINGS: Bones/Joint/Cartilage No definite acute fracture. There is a large, well corticated ossific density along the posterosuperior margin of the right hip joint. There is moderate concentric right hip joint space narrowing. No hip joint effusion. The bones are diffusely osteopenic. Old  healed fracture of the right inferior pubic ramus. Ligaments Suboptimally assessed by CT. Muscles and Tendons Atrophy of the gluteal muscles. Soft tissues Mild subcutaneous edema about the right hip. The visualized intrapelvic structures are unremarkable. IMPRESSION: 1. No definite acute fracture of the right hip. Large, well corticated ossific density along the posterosuperior margin of the right hip joint may be related to prior trauma or heterotopic ossification. If occult hip fracture is suspected or if the patient is unable to bear weight, consider MRI for further evaluation. 2. Moderate right hip osteoarthritis. Electronically Signed   By: Obie DredgeWilliam T Derry M.D.   On: 05/27/2017 11:56   Scheduled Meds: . cholecalciferol  1,000 Units Oral Daily  . enoxaparin (LOVENOX) injection  30 mg Subcutaneous Q24H  . metoprolol tartrate  25 mg Oral Daily  . polyethylene glycol  17 g Oral BID  . senna-docusate  1 tablet Oral BID   Continuous Infusions: . sodium chloride 75 mL/hr at 05/27/17 0446  . lactated ringers 10 mL/hr at 05/25/17 1524  . methocarbamol (ROBAXIN)  IV Stopped (05/26/17 0144)    LOS: 2 days   Merlene Laughtermair Latif Sheikh, DO [facility] Pager [phone]  If 7PM-7AM, please contact night-coverage [email or web address] Password [email or web address] 05/27/2017, 7:33 PM

## 2017-05-28 ENCOUNTER — Encounter (HOSPITAL_COMMUNITY): Payer: Self-pay | Admitting: General Practice

## 2017-05-28 ENCOUNTER — Other Ambulatory Visit: Payer: Self-pay

## 2017-05-28 LAB — BASIC METABOLIC PANEL
ANION GAP: 9 (ref 5–15)
BUN: 42 mg/dL — ABNORMAL HIGH (ref 6–20)
CALCIUM: 8.9 mg/dL (ref 8.9–10.3)
CO2: 22 mmol/L (ref 22–32)
Chloride: 106 mmol/L (ref 101–111)
Creatinine, Ser: 1.36 mg/dL — ABNORMAL HIGH (ref 0.44–1.00)
GFR, EST AFRICAN AMERICAN: 37 mL/min — AB (ref >60)
GFR, EST NON AFRICAN AMERICAN: 32 mL/min — AB (ref >60)
Glucose, Bld: 107 mg/dL — ABNORMAL HIGH (ref 65–99)
POTASSIUM: 3.7 mmol/L (ref 3.5–5.1)
SODIUM: 137 mmol/L (ref 135–145)

## 2017-05-28 LAB — CBC
HCT: 23.4 % — ABNORMAL LOW (ref 36.0–46.0)
Hemoglobin: 7.8 g/dL — ABNORMAL LOW (ref 12.0–15.0)
MCH: 32 pg (ref 26.0–34.0)
MCHC: 33.3 g/dL (ref 30.0–36.0)
MCV: 95.9 fL (ref 78.0–100.0)
PLATELETS: 153 10*3/uL (ref 150–400)
RBC: 2.44 MIL/uL — AB (ref 3.87–5.11)
RDW: 15.9 % — AB (ref 11.5–15.5)
WBC: 11.6 10*3/uL — AB (ref 4.0–10.5)

## 2017-05-28 MED ORDER — BUPIVACAINE IN DEXTROSE 0.75-8.25 % IT SOLN
INTRATHECAL | Status: DC | PRN
  Administered 2017-05-25: 1.4 mL via INTRATHECAL

## 2017-05-28 MED ORDER — INFLUENZA VAC SPLIT HIGH-DOSE 0.5 ML IM SUSY
0.5000 mL | PREFILLED_SYRINGE | INTRAMUSCULAR | Status: AC
  Administered 2017-05-29: 0.5 mL via INTRAMUSCULAR
  Filled 2017-05-28: qty 0.5

## 2017-05-28 NOTE — Addendum Note (Signed)
Addendum  created 05/28/17 2056 by Val EagleMoser, Kalana Yust, MD   Child order released for a procedure order, Intraprocedure Blocks edited, Intraprocedure Meds edited, Sign clinical note

## 2017-05-28 NOTE — Clinical Social Work Note (Signed)
Clinical Social Work Assessment  Patient Details  Name: Charlotte Huber MRN: [id number] Date of Birth: 04/30/1919  Date of referral:  05/28/17               Reason for consult:  Facility Placement                Permission sought to share information with:  Facility Medical sales representativeContact Representative, Family Supports Permission granted to share information::     Name::     [employer]  Agency::     Relationship::  sons  Contact Information:     Housing/Transportation Living arrangements for the past 2 months:  Skilled Building surveyorursing Facility Source of Information:  Adult Children Patient Interpreter Needed:  None Criminal Activity/Legal Involvement Pertinent to Current Situation/Hospitalization:  No - Comment as needed Significant Relationships:  Adult Children, Other Family Members Lives with:  Adult Children Do you feel safe going back to the place where you live?  Yes Need for family participation in patient care:  Yes (Comment)(decision making)  Care giving concerns:  Pt lives with adult son Melvyn NethLewis in [place] and is only oriented to self. Pt sons would like pt to [facility] in [place] to be close to home.     Social Worker assessment / plan: CSW spoke with pt son at bedside, Lewis. Pt is confused (called out granddaughters name to CSW repeatedly) and has significant hearing loss. Pt has been living with her son in [place] and recieves care from her adult sons, their spouses and other family members. Pt has a family member that [facility] [place] and pt family is [facility]. CSW explained to son that pt has had a Recruitment consultantsafety sitter while at the hospital and it will need to be 24 hours before pt is okay to discharge to SNF, as well as waiting on discharge summary from MD. CSW continuing to follow to support discharge [facility] [place] tomorrow   Employment status:  Retired Community education officernsurance information:  Medicare PT Recommendations:   Skilled Nursing Facility Information / Referral to community resources:  Skilled Nursing Facility  Patient/Family's Response to care:  Pt family grateful for CSW support in helping pt transfer to family preferred SNF [place].  Patient/Family's Understanding of and Emotional Response to Diagnosis, Current Treatment, and Prognosis:  Pt son states understanding of diagnosis, current treatment, and prognosis. Pt sons have been providing care for pt for "awhile" and feel okay to continue doing so after SNF.    Emotional Assessment Appearance:  Appears stated age Attitude/Demeanor/Rapport:  (restless; confused) Affect (typically observed):  Restless(confused) Orientation:  Oriented to Self Alcohol / Substance use:  Tobacco Use(former smoker) Psych involvement (Current and /or in the community):  No (Comment)  Discharge Needs  Concerns to be addressed:  Care Coordination, Discharge Planning Concerns Readmission within the last 30 days:  No Current discharge risk:  Cognitively Impaired Barriers to Discharge:  Continued Medical Work up, Requiring sitter/restraints   Doy Hutchingsabel H Augustino Savastano, LCSWA 05/28/2017, 3:22 PM

## 2017-05-28 NOTE — Anesthesia Procedure Notes (Signed)
Spinal  Patient location during procedure: OB Start time: 05/25/2017 4:29 PM End time: 05/25/2017 4:34 PM Staffing Anesthesiologist: Val EagleMoser, Aaden Buckman, MD Preanesthetic Checklist Completed: patient identified, surgical consent, pre-op evaluation, timeout performed, IV checked, risks and benefits discussed and monitors and equipment checked Spinal Block Patient position: left lateral decubitus Prep: ChloraPrep and site prepped and draped Patient monitoring: heart rate, cardiac monitor, continuous pulse ox and blood pressure Approach: midline Location: L4-5 Injection technique: single-shot Needle Needle type: Pencan  Needle gauge: 24 G Needle length: 10 cm Assessment Sensory level: T6

## 2017-05-28 NOTE — Social Work (Addendum)
CSW acknowledging pt has a Comptrollersitter, must be without sitter for 24hours to discharge to SNF.   2:30pm-CSW informed pt sitter d/c at 11:00am  2:45pm- CSW spoke with pt son at bedside, they have called over to [facility] [place]. They would [facility] due to proximity to their home. Informed pt son that discharge could occur after 11am tomorrow due to 24 hour sitter policy. Pt son voiced understanding, stated that either he or his brother would be around tomorrow to help pt with discharge.    CSW continuing to follow to support discharge to SNF when medically appropriate.   Doy HutchingIsabel H Cali Huber, LCSWA [facility] Clinical Social Work [phone]

## 2017-05-28 NOTE — Progress Notes (Signed)
Initial Nutrition Assessment  DOCUMENTATION CODES:   Not applicable  INTERVENTION:   Snacks BID  NUTRITION DIAGNOSIS:   Increased nutrient needs related to hip fracture as evidenced by estimated needs.  GOAL:   Patient will meet greater than or equal to 90% of their needs  MONITOR:   PO intake, Weight trends, I & O's  REASON FOR ASSESSMENT:   Consult Assessment of nutrition requirement/status, Hip fracture protocol  ASSESSMENT:   Pt with HTN, dementia, pacemaker (2010) and osteoporosis presents with closed L hip fracture s/p unwitnessed mechanical fall. Pt s/p INTRAMEDULLARY (IM) NAIL INTERTROCHANTRIC surgical repair on 12/28     Spoke with sitter, pt, pt's son and pt's grandson at bedside. Pt very hard of hearing.  Family reports pt does not use assisting equipment at baseline and "gets around well." Pt's son reports pt's UBW of 102 lbs. No recent weight history available per chart.  Family reports pt has a good appetite at baseline consuming 2-3 meals per day plus snacks.  Meal completion records reveal pt is consuming between 10-50% of meals at this time.  Family reports pt does not like or wish to consume nutritional supplements during admission, RD to order snacks.  Pt s/p 1 unit pRBC transfusion.   Labs reviewed; Magnesium 1.5 (12/30), BUN 42, Creatinine 1.36, Hemoglobin 7.8, Albumin 2.9 (12/30) Medications reviewed; Vitamin D, Miralax, Senokot-S  NUTRITION - FOCUSED PHYSICAL EXAM:    Most Recent Value  Orbital Region  Mild depletion  Upper Arm Region  No depletion  Thoracic and Lumbar Region  No depletion  Buccal Region  No depletion  Temple Region  Mild depletion  Clavicle Bone Region  Moderate depletion  Clavicle and Acromion Bone Region  Moderate depletion  Scapular Bone Region  Unable to assess  Dorsal Hand  Unable to assess  Patellar Region  Moderate depletion  Anterior Thigh Region  Severe depletion  Posterior Calf Region  Moderate depletion   Edema (RD Assessment)  None      Diet Order:  Diet regular Room service appropriate? Yes; Fluid consistency: Thin  EDUCATION NEEDS:   Not appropriate for education at this time  Skin:  Skin Assessment: Skin Integrity Issues: Skin Integrity Issues:: Other (Comment) Other: open tear at lower R arm  Last BM:  05/28/17  Height:   Ht Readings from Last 1 Encounters:  No data found for Ht   Pt's son reports she may be "under 5 feet now", however unsure of exact height.   Weight:   Wt Readings from Last 1 Encounters:  05/25/17 114 lb 10.2 oz (52 kg)    Ideal Body Weight:   There is no height to calculate IBW, based on estimated height by son IBW: 44.7 kg  BMI:  There is no height or weight on file to calculate BMI.  Estimated Nutritional Needs:   Kcal:  1300-1500  Protein:  65-75 grams   Fluid:  >/= 1.5 L/d  Fransisca KaufmannAllison Ioannides, MS, RDN, LDN 05/28/2017 1:13 PM

## 2017-05-28 NOTE — Progress Notes (Signed)
Physical Therapy Treatment Patient Details Name: Charlotte Huber MRN: [id number] DOB: 02/06/1919 Today's Date: 05/28/2017    History of Present Illness 83 y.o. female with medical history significant for HTN, dementia, AICD, osteoporosis who [facility] after an unwitnessed fall. Now s/p INTRAMEDULLARY (IM) NAIL INTERTROCHANTRIC (Left Hip).    PT Comments    Pt progressing towards physical therapy goals. Was able to perform transfers with +2 total assist for OOB mobility. Much more vocal this session but difficult to communicate due to decreased hearing. Even with hearing aide in and therapist talking loudly into L ear, pt still unable to hear. SNF continues to be the most appropriate d/c disposition.    Follow Up Recommendations  SNF;Supervision/Assistance - 24 hour     Equipment Recommendations  None recommended by PT(TBD by next venue of care)    Recommendations for Other Services       Precautions / Restrictions Precautions Precautions: Fall Restrictions Weight Bearing Restrictions: Yes LLE Weight Bearing: Weight bearing as tolerated    Mobility  Bed Mobility Overal bed mobility: Needs Assistance Bed Mobility: Supine to Sit     Supine to sit: Total assist;+2 for physical assistance     General bed mobility comments: With use of bed pad and helicopter technique.  Transfers Overall transfer level: Needs assistance Equipment used: 2 person hand held assist Transfers: Sit to/from [employer];Stand Pivot Transfers Sit to Stand: Total assist;+2 physical assistance Stand pivot transfers: Total assist;+2 physical assistance       General transfer comment: Total assist +2 with gait belt for sit to stand and stand pivot toward R side. Pt noted to have bilateral knee buckle with weight shift to the R to pivot.   Ambulation/Gait                 Stairs            Wheelchair Mobility    Modified Rankin (Stroke Patients Only)       Balance  Overall balance assessment: Needs assistance Sitting-balance support: Feet supported;Bilateral upper extremity supported Sitting balance-Leahy Scale: Poor Sitting balance - Comments: Posterior and R lateral lean in sitting. Postural control: Posterior lean;Right lateral lean Standing balance support: Bilateral upper extremity supported Standing balance-Leahy Scale: Zero Standing balance comment: Total assist +2 for standing balance.                            Cognition Arousal/Alertness: Awake/alert Behavior During Therapy: Flat affect Overall Cognitive Status: No family/caregiver present to determine baseline cognitive functioning                                 General Comments: Pt is very HOH; difficult to differentiate cognitive deficits vs unable to hear questions/commands. Pt with baseline dementia per chart.      Exercises      General Comments        Pertinent Vitals/Pain Pain Assessment: Faces Faces Pain Scale: Hurts even more Pain Location: L hip Pain Descriptors / Indicators: Grimacing Pain Intervention(s): Monitored during session;Repositioned    Home Living                      Prior Function            PT Goals (current goals can now be found in the care plan section) Acute Rehab PT Goals Patient Stated  Goal: none stated PT Goal Formulation: Patient unable to participate in goal setting Time For Goal Achievement: 06/09/17 Potential to Achieve Goals: Good Progress towards PT goals: Progressing toward goals    Frequency    Min 3X/week      PT Plan Current plan remains appropriate    Co-evaluation PT/OT/SLP Co-Evaluation/Treatment: Yes            AM-PAC PT "6 Clicks" Daily Activity  Outcome Measure  Difficulty turning over in bed (including adjusting bedclothes, sheets and blankets)?: Unable Difficulty moving from lying on back to sitting on the side of the bed? : Unable Difficulty sitting down on and  standing up from a chair with arms (e.g., wheelchair, bedside commode, etc,.)?: Unable Help needed moving to and from a bed to chair (including a wheelchair)?: Total Help needed walking in hospital room?: Total Help needed climbing 3-5 steps with a railing? : Total 6 Click Score: 6    End of Session Equipment Utilized During Treatment: Gait belt Activity Tolerance: Patient tolerated treatment well Patient left: in chair;with call bell/phone within reach;with chair alarm set;with family/visitor present Nurse Communication: Mobility status PT Visit Diagnosis: Pain;Difficulty in walking, not elsewhere classified (R26.2);History of falling (Z91.81) Pain - part of body: Hip;Leg     Time: 1201-1223 PT Time Calculation (min) (ACUTE ONLY): 22 min  Charges:  $Gait Training: 8-22 mins                    G Codes:       Charlotte Huber, PT, DPT Acute Rehabilitation Services Pager: [phone]    Marylynn PearsonLaura D Tejah Brekke 05/28/2017, 12:55 PM

## 2017-05-28 NOTE — Progress Notes (Signed)
PROGRESS NOTE    Charlotte Huber  [id number] DOB: 12/25/1918 DOA: 05/25/2017 PCP: Patient, No Pcp Per   Brief Narrative: Charlotte Huber is a 83 y.o. femalewith medical history significant forHTN, dementia, AICD, osteoporosis who [facility] after an unwitnessed fall that occurred yesterday around 1700. Patient had bowel incontinence earlier that day. Prior to that was in her usual state of health.In the ED a pelvis portable xray revealed:Comminuted intertrochanteric fracture on the left with varus angulation at fracture site. No other fractures are evident. No dislocation. There is symmetric moderate narrowing of the hip joints bilaterally. Bones are diffusely osteoporotic.Orthopedic surgery consulted and patient underwent surgical repair this afternoon 05/25/17. She has been intermittently agitated and Cr is elevated so Lisinopril-HCTZ discontinued. Blood Count dropped so she was transfused 1 unit of pRBC's.   Assessment & Plan:   Active Problems:   Closed left hip fracture (HCC)   Unwitnessed fall   Osteoporosis   Acute kidney injury superimposed on chronic kidney disease (HCC)   Normocytic anemia   Acute blood loss as cause of postoperative anemia   Malnutrition (HCC)   Hypomagnesemia   Benign essential HTN   Leukocytosis   Acute delirium   Right hip pain   Closed left hip fracture Patient is s/p intramedullary implant on 05/25/17. -PT recommendations: SNF -Orthopedic surgery recommendations  Unwitnessed fall CT head without abnormalities. Patient with sitter overnight. No events. -Cardiology consulted for pacer interrogation -Discontinue sitter  Osteoporosis -Continue fall precautions, vitamin D and calcium  Acute kidney injury on CKD 3 Unknown baseline. Creatinine of 1.23 on admission. Initially worsened with a peak of 1.64. Now improved to 1.36 with fluids/blood.  Normocytic anemia Blood loss anemia Baseline in addition to recent surgery.  Hemoglobin 7.8 s/p 1 unit of PRBC on 05/27/17.  Moderate protein calorie malnutrition -RD recommending protein supplements but patient declining  Hypomagnesemia Given supplementation.  Essential hypertension Continue metoprolol and hydralazine prn  Leukocytosis Improved today. In setting of acute stress and recent surgery. Afebrile.  Acute delirium/agitation Patient with dementia. Likely secondary to pain. Resolved.  Right hip pain CT unremarkable for acute fracture. Patient still with some hip pain -orthopedic surgery recommendations    DVT prophylaxis: Lovenox Code Status: DNR Family Communication: Son at bedside Disposition Plan: Discharge to SNF   Consultants:   Orthopedic surgery  Procedures:   Treatment of intertrochanteric fracture with intramedullary implant  Antimicrobials:  Cefazolin    Subjective: Patient has some right hip pain. No other concerns.  Objective: Vitals:   05/27/17 1741 05/28/17 0006 05/28/17 0514 05/28/17 1034  BP: (!) 109/56 (!) 125/54 (!) 225/63 (!) 108/55  Pulse: 80 72 80   Resp: 16 16 16    Temp: 99 F (37.2 C) 98.4 F (36.9 C) 97.9 F (36.6 C)   TempSrc: Axillary Axillary Axillary   SpO2: 100% 100% 100%   Weight:        Intake/Output Summary (Last 24 hours) at 05/28/2017 1036 Last data filed at 05/28/2017 0900 Gross per 24 hour  Intake 2940 ml  Output 225 ml  Net 2715 ml   Filed Weights   05/25/17 0300  Weight: 52 kg (114 lb 10.2 oz)    Examination:  General exam: Appears calm and comfortable Respiratory system: Clear to auscultation. Respiratory effort normal. Cardiovascular system: S1 & S2 heard, RRR. No murmurs, rubs, gallops or clicks. Gastrointestinal system: Abdomen is nondistended, soft and nontender. No organomegaly or masses felt. Normal bowel sounds heard. Central nervous system:  Alert. Bilateral hearing loss. No focal neurological deficits. Extremities: No edema. No calf tenderness. No hip  tenderness but complaints of pain. Skin: No cyanosis. Large area of ecchymosis of lateral left hip without overlying tenderness Psychiatry: Judgement and insight appear impaired. Flat affect    Data Reviewed: I have personally reviewed following labs and imaging studies  CBC: Recent Labs  Lab 05/25/17 0551 05/25/17 2020 05/26/17 0653 05/27/17 0427 05/28/17 0405  WBC 14.4* 17.2* 18.3* 15.1* 11.6*  NEUTROABS 12.3*  --   --  11.6*  --   HGB 9.1* 8.5* 7.3* 7.0* 7.8*  HCT 27.8* 26.1* 23.4* 21.6* 23.4*  MCV 98.6 98.9 99.2 100.0 95.9  PLT 203 204 170 154 153   Basic Metabolic Panel: Recent Labs  Lab 05/25/17 0551 05/25/17 2020 05/26/17 0653 05/27/17 0427  NA 142  --  143 144  K 3.8  --  3.9 3.8  CL 106  --  112* 113*  CO2 27  --  23 22  GLUCOSE 138*  --  127* 110*  BUN 26*  --  32* 38*  CREATININE 1.23* 1.18* 1.44* 1.64*  CALCIUM 9.2  --  8.4* 8.5*  MG  --   --   --  1.5*  PHOS  --   --   --  3.4   GFR: CrCl cannot be calculated (Unknown ideal weight.). Liver Function Tests: Recent Labs  Lab 05/25/17 0551 05/27/17 0427  AST 21 121*  ALT 13* 9*  ALKPHOS 50 42  BILITOT 0.7 0.7  PROT 5.8* 5.3*  ALBUMIN 3.4* 2.9*   No results for input(s): LIPASE, AMYLASE in the last 168 hours. No results for input(s): AMMONIA in the last 168 hours. Coagulation Profile: Recent Labs  Lab 05/25/17 0551  INR 1.14   Cardiac Enzymes: No results for input(s): CKTOTAL, CKMB, CKMBINDEX, TROPONINI in the last 168 hours. BNP (last 3 results) No results for input(s): PROBNP in the last 8760 hours. HbA1C: No results for input(s): HGBA1C in the last 72 hours. CBG: No results for input(s): GLUCAP in the last 168 hours. Lipid Profile: No results for input(s): CHOL, HDL, LDLCALC, TRIG, CHOLHDL, LDLDIRECT in the last 72 hours. Thyroid Function Tests: No results for input(s): TSH, T4TOTAL, FREET4, T3FREE, THYROIDAB in the last 72 hours. Anemia Panel: No results for input(s): VITAMINB12,  FOLATE, FERRITIN, TIBC, IRON, RETICCTPCT in the last 72 hours. Sepsis Labs: No results for input(s): PROCALCITON, LATICACIDVEN in the last 168 hours.  Recent Results (from the past 240 hour(s))  MRSA PCR Screening     Status: None   Collection Time: 05/25/17 10:44 AM  Result Value Ref Range Status   MRSA by PCR NEGATIVE NEGATIVE Final    Comment:        The GeneXpert MRSA Assay (FDA approved for NASAL specimens only), is one component of a comprehensive MRSA colonization surveillance program. It is not intended to diagnose MRSA infection nor to guide or monitor treatment for MRSA infections.          Radiology Studies: Ct Head Wo Contrast  Result Date: 05/26/2017 CLINICAL DATA:  Blunt head trauma.  Headache.  Initial encounter. EXAM: CT HEAD WITHOUT CONTRAST TECHNIQUE: Contiguous axial images were obtained from the base of the skull through the vertex without intravenous contrast. COMPARISON:  None. FINDINGS: Technically suboptimal due to motion artifact. Brain: No evidence of acute infarction, hemorrhage, hydrocephalus, extra-axial collection, or mass lesion/mass effect. Mild to moderate diffuse cerebral atrophy and chronic small vessel disease. Vascular:  No  hyperdense vessel or other acute findings. Skull: No evidence of fracture or other significant bone abnormality. Sinuses/Orbits:  No acute findings. Other: None. IMPRESSION: No acute intracranial abnormality. Mild to moderate cerebral atrophy and chronic small vessel disease. Electronically Signed   By: Myles RosenthalJohn  Stahl M.D.   On: 05/26/2017 15:27   Ct Hip Right Wo Contrast  Result Date: 05/27/2017 CLINICAL DATA:  Recent fall with left hip intertrochanteric fracture status post ORIF, now with worsening right hip pain and concern for fracture. EXAM: CT OF THE RIGHT HIP WITHOUT CONTRAST TECHNIQUE: Multidetector CT imaging of the right hip was performed according to the standard protocol. Multiplanar CT image reconstructions were  also generated. COMPARISON:  Pelvic x-rays dated May 25, 2017. FINDINGS: Bones/Joint/Cartilage No definite acute fracture. There is a large, well corticated ossific density along the posterosuperior margin of the right hip joint. There is moderate concentric right hip joint space narrowing. No hip joint effusion. The bones are diffusely osteopenic. Old healed fracture of the right inferior pubic ramus. Ligaments Suboptimally assessed by CT. Muscles and Tendons Atrophy of the gluteal muscles. Soft tissues Mild subcutaneous edema about the right hip. The visualized intrapelvic structures are unremarkable. IMPRESSION: 1. No definite acute fracture of the right hip. Large, well corticated ossific density along the posterosuperior margin of the right hip joint may be related to prior trauma or heterotopic ossification. If occult hip fracture is suspected or if the patient is unable to bear weight, consider MRI for further evaluation. 2. Moderate right hip osteoarthritis. Electronically Signed   By: Obie DredgeWilliam T Derry M.D.   On: 05/27/2017 11:56        Scheduled Meds: . cholecalciferol  1,000 Units Oral Daily  . enoxaparin (LOVENOX) injection  30 mg Subcutaneous Q24H  . metoprolol tartrate  25 mg Oral Daily  . polyethylene glycol  17 g Oral BID  . senna-docusate  1 tablet Oral BID   Continuous Infusions: . sodium chloride 75 mL/hr at 05/28/17 0100  . lactated ringers 10 mL/hr at 05/25/17 1524  . methocarbamol (ROBAXIN)  IV Stopped (05/28/17 0200)     LOS: 3 days     Jacquelin Hawkingalph Socrates Cahoon, MD [facility] 05/28/2017, 10:36 AM Pager: [phone]  If 7PM-7AM, please contact night-coverage [email or web address] Password [email or web address] 05/28/2017, 10:36 AM

## 2017-05-29 LAB — TYPE AND SCREEN
ABO/RH(D): O POS
Antibody Screen: POSITIVE
PT AG TYPE: NEGATIVE
Unit division: 0
Unit division: 0

## 2017-05-29 LAB — BPAM RBC
Blood Product Expiration Date: 201901252359
Blood Product Expiration Date: 201901252359
ISSUE DATE / TIME: 201812301443
UNIT TYPE AND RH: 5100
UNIT TYPE AND RH: 5100

## 2017-05-29 NOTE — Social Work (Addendum)
CSW informed that [facility] (pt selected SNF) does not have staff on today to accept admissions.   CSW will page MD and inform family.   3:00pm- CSW spoke again with family, pt son is okay with transfer tomorrow, he is going to leave. Pt son is going to leave for the day because "I want to see how she does without any family here."   CSW continuing to follow to support discharge when SNF has appropriate staffing.   Doy HutchingIsabel H Veer Elamin, LCSWA [facility] Clinical Social Work [phone]

## 2017-05-29 NOTE — Progress Notes (Signed)
PROGRESS NOTE    Charlotte Huber  ZOX:[id number] DOB: Oct 16, 1918 DOA: 05/25/2017 PCP: Patient, No Pcp Per   Brief Narrative: Charlotte Huber is a 84 y.o. femalewith medical history significant forHTN, dementia, AICD, osteoporosis who presented at [facility] from home after an unwitnessed fall that occurred yesterday around 1700. Patient had bowel incontinence earlier that day. Prior to that was in her usual state of health.In the ED a pelvis portable xray revealed:Comminuted intertrochanteric fracture on the left with varus angulation at fracture site. No other fractures are evident. No dislocation. There is symmetric moderate narrowing of the hip joints bilaterally. Bones are diffusely osteoporotic.Orthopedic surgery consulted and patient underwent surgical repair this afternoon 05/25/17. She has been intermittently agitated and Cr is elevated so Lisinopril-HCTZ discontinued. Blood Count dropped so she was transfused 1 unit of pRBC's.   Assessment & Plan:   Active Problems:   Closed left hip fracture (HCC)   Unwitnessed fall   Osteoporosis   Acute kidney injury superimposed on chronic kidney disease (HCC)   Normocytic anemia   Acute blood loss as cause of postoperative anemia   Malnutrition (HCC)   Hypomagnesemia   Benign essential HTN   Leukocytosis   Acute delirium   Right hip pain   Closed left hip fracture Patient is s/p intramedullary implant on 05/25/17. -PT recommendations: SNF -Orthopedic surgery recommendations: Lovenox  Unwitnessed fall CT head without abnormalities. Patient with sitter overnight. No events. -Cardiology consulted for pacer interrogation  Osteoporosis -Continue fall precautions, vitamin D and calcium  Acute kidney injury on CKD 3 Unknown baseline. Creatinine of 1.23 on admission. Initially worsened with a peak of 1.64. Now improved to 1.36 with fluids/blood.  Normocytic anemia Blood loss anemia Baseline in addition to recent surgery. Hemoglobin 7.8  s/p 1 unit of PRBC on 05/27/17. Stable.  Moderate protein calorie malnutrition -RD recommending protein supplements but patient declining  Hypomagnesemia Given supplementation.  Essential hypertension Continue metoprolol and hydralazine prn  Leukocytosis Improved today. In setting of acute stress and recent surgery. Afebrile.  Acute delirium/agitation Patient with dementia. Likely secondary to pain. Resolved.  Right hip pain CT unremarkable for acute fracture.    DVT prophylaxis: Lovenox Code Status: DNR Family Communication: Son at bedside Disposition Plan: Discharge to SNF when bed available.   Consultants:   Orthopedic surgery  Procedures:   Treatment of intertrochanteric fracture with intramedullary implant  Antimicrobials:  Cefazolin    Subjective: Some hip pain. Otherwise no concerns.  Objective: Vitals:   05/28/17 1038 05/28/17 1347 05/28/17 1952 05/29/17 0438  BP:  (!) 109/59 112/61 (!) 144/66  Pulse: 99 81 90 (!) 104  Resp:  18 17 19   Temp:  98.1 F (36.7 C) 98 F (36.7 C) 98 F (36.7 C)  TempSrc:  Oral Oral Oral  SpO2:  100% 99% 100%  Weight:        Intake/Output Summary (Last 24 hours) at 05/29/2017 1420 Last data filed at 05/29/2017 0930 Gross per 24 hour  Intake 1629 ml  Output -  Net 1629 ml   Filed Weights   05/25/17 0300  Weight: 52 kg (114 lb 10.2 oz)    Examination:  General exam: Appears calm and comfortable Respiratory system: Clear to auscultation. Respiratory effort normal. Cardiovascular system: S1 & S2 heard, RRR. No murmurs, rubs, gallops or clicks. Gastrointestinal system: Abdomen is nondistended, soft and nontender. No organomegaly or masses felt. Normal bowel sounds heard. Central nervous system: Alert. Bilateral hearing loss. No focal neurological deficits. Extremities: No edema.  No calf tenderness. Skin: No cyanosis. Large area of ecchymosis of lateral left hip without overlying tenderness Psychiatry: Judgement  and insight appear impaired. Flat affect    Data Reviewed: I have personally reviewed following labs and imaging studies  CBC: Recent Labs  Lab 05/25/17 0551 05/25/17 2020 05/26/17 0653 05/27/17 0427 05/28/17 0405  WBC 14.4* 17.2* 18.3* 15.1* 11.6*  NEUTROABS 12.3*  --   --  11.6*  --   HGB 9.1* 8.5* 7.3* 7.0* 7.8*  HCT 27.8* 26.1* 23.4* 21.6* 23.4*  MCV 98.6 98.9 99.2 100.0 95.9  PLT 203 204 170 154 153   Basic Metabolic Panel: Recent Labs  Lab 05/25/17 0551 05/25/17 2020 05/26/17 0653 05/27/17 0427 05/28/17 1053  NA 142  --  143 144 137  K 3.8  --  3.9 3.8 3.7  CL 106  --  112* 113* 106  CO2 27  --  23 22 22   GLUCOSE 138*  --  127* 110* 107*  BUN 26*  --  32* 38* 42*  CREATININE 1.23* 1.18* 1.44* 1.64* 1.36*  CALCIUM 9.2  --  8.4* 8.5* 8.9  MG  --   --   --  1.5*  --   PHOS  --   --   --  3.4  --    GFR: CrCl cannot be calculated (Unknown ideal weight.). Liver Function Tests: Recent Labs  Lab 05/25/17 0551 05/27/17 0427  AST 21 121*  ALT 13* 9*  ALKPHOS 50 42  BILITOT 0.7 0.7  PROT 5.8* 5.3*  ALBUMIN 3.4* 2.9*   No results for input(s): LIPASE, AMYLASE in the last 168 hours. No results for input(s): AMMONIA in the last 168 hours. Coagulation Profile: Recent Labs  Lab 05/25/17 0551  INR 1.14   Cardiac Enzymes: No results for input(s): CKTOTAL, CKMB, CKMBINDEX, TROPONINI in the last 168 hours. BNP (last 3 results) No results for input(s): PROBNP in the last 8760 hours. HbA1C: No results for input(s): HGBA1C in the last 72 hours. CBG: No results for input(s): GLUCAP in the last 168 hours. Lipid Profile: No results for input(s): CHOL, HDL, LDLCALC, TRIG, CHOLHDL, LDLDIRECT in the last 72 hours. Thyroid Function Tests: No results for input(s): TSH, T4TOTAL, FREET4, T3FREE, THYROIDAB in the last 72 hours. Anemia Panel: No results for input(s): VITAMINB12, FOLATE, FERRITIN, TIBC, IRON, RETICCTPCT in the last 72 hours. Sepsis Labs: No results for  input(s): PROCALCITON, LATICACIDVEN in the last 168 hours.  Recent Results (from the past 240 hour(s))  MRSA PCR Screening     Status: None   Collection Time: 05/25/17 10:44 AM  Result Value Ref Range Status   MRSA by PCR NEGATIVE NEGATIVE Final    Comment:        The GeneXpert MRSA Assay (FDA approved for NASAL specimens only), is one component of a comprehensive MRSA colonization surveillance program. It is not intended to diagnose MRSA infection nor to guide or monitor treatment for MRSA infections.          Radiology Studies: No results found.      Scheduled Meds: . cholecalciferol  1,000 Units Oral Daily  . enoxaparin (LOVENOX) injection  30 mg Subcutaneous Q24H  . metoprolol tartrate  25 mg Oral Daily  . polyethylene glycol  17 g Oral BID  . senna-docusate  1 tablet Oral BID   Continuous Infusions: . sodium chloride 75 mL/hr at 05/28/17 2140  . lactated ringers 10 mL/hr at 05/25/17 1524  . methocarbamol (ROBAXIN)  IV Stopped (05/29/17  [id number])     LOS: 4 days     Jacquelin Hawkingalph Navia Lindahl, MD [facility] 05/29/2017, 2:20 PM Pager: [phone]  If 7PM-7AM, please contact night-coverage [email or web address] Password [email or web address] 05/29/2017, 2:20 PM

## 2017-05-30 ENCOUNTER — Encounter (HOSPITAL_COMMUNITY): Payer: Self-pay | Admitting: Orthopaedic Surgery

## 2017-05-30 MED ORDER — QUETIAPINE FUMARATE 50 MG PO TABS
25.0000 mg | ORAL_TABLET | Freq: Every day | ORAL | Status: DC
  Administered 2017-05-30: 25 mg via ORAL
  Filled 2017-05-30: qty 1

## 2017-05-30 MED ORDER — LORAZEPAM 2 MG/ML IJ SOLN: 0.5000 mg | Freq: Once | INTRAMUSCULAR | Status: DC | PRN

## 2017-05-30 MED ORDER — METOPROLOL TARTRATE 50 MG PO TABS
50.0000 mg | ORAL_TABLET | Freq: Every day | ORAL | Status: DC
  Administered 2017-05-31: 50 mg via ORAL
  Filled 2017-05-30: qty 1

## 2017-05-30 NOTE — Progress Notes (Signed)
Physical Therapy Treatment Patient Details Name: Charlotte Huber MRN: [id number] DOB: 10/04/1918 Today's Date: 05/30/2017    History of Present Illness 84 y.o. female with medical history significant for HTN, dementia, AICD, osteoporosis who [facility] after an unwitnessed fall. Now s/p INTRAMEDULLARY (IM) NAIL INTERTROCHANTRIC (Left Hip).    PT Comments    Pt progressing towards physical therapy goals. Continues to be limited by pain and decreased tolerance for functional activity. Communication remains difficult as pt is extremely hard of hearing. Attempted therapeutic exercise this session however pt unable to follow commands or mimic therapist to perform exercise at this time. Will continue to follow and progress as able per POC.    Follow Up Recommendations  SNF;Supervision/Assistance - 24 hour     Equipment Recommendations  None recommended by PT(TBD by next venue of care)    Recommendations for Other Services       Precautions / Restrictions Precautions Precautions: Fall Restrictions Weight Bearing Restrictions: Yes LLE Weight Bearing: Weight bearing as tolerated    Mobility  Bed Mobility Overal bed mobility: Needs Assistance Bed Mobility: Supine to Sit     Supine to sit: Max assist;+2 for physical assistance;HOB elevated     General bed mobility comments: With use of bed pad and helicopter technique. Pt initially calling out that she is cold and does not want to get up but was agreeable upon initiation.   Transfers Overall transfer level: Needs assistance Equipment used: 2 person hand held assist Transfers: Sit to/from [employer];Stand Pivot Transfers Sit to Stand: Total assist;+2 physical assistance Stand pivot transfers: Total assist;+2 physical assistance       General transfer comment: Total assist +2 with gait belt for sit to stand and stand pivot toward R side. Pt noted to have bilateral knee buckle with weight shift to the R to pivot.    Ambulation/Gait             General Gait Details: Unable at this time.    Stairs            Wheelchair Mobility    Modified Rankin (Stroke Patients Only)       Balance Overall balance assessment: Needs assistance Sitting-balance support: Feet supported;Bilateral upper extremity supported Sitting balance-Leahy Scale: Poor Sitting balance - Comments: Posterior and R lateral lean in sitting. Postural control: Posterior lean;Right lateral lean Standing balance support: Bilateral upper extremity supported Standing balance-Leahy Scale: Zero Standing balance comment: Total assist +2 for standing balance.                            Cognition Arousal/Alertness: Awake/alert Behavior During Therapy: Flat affect Overall Cognitive Status: No family/caregiver present to determine baseline cognitive functioning                                 General Comments: Pt is very HOH; difficult to differentiate cognitive deficits vs unable to hear questions/commands. Pt with baseline dementia per chart.      Exercises      General Comments        Pertinent Vitals/Pain Pain Assessment: Faces Faces Pain Scale: Hurts even more Pain Location: L hip Pain Descriptors / Indicators: Grimacing;Operative site guarding Pain Intervention(s): Limited activity within patient's tolerance;Monitored during session;Repositioned    Home Living  Prior Function            PT Goals (current goals can now be found in the care plan section) Acute Rehab PT Goals Patient Stated Goal: none stated PT Goal Formulation: Patient unable to participate in goal setting Time For Goal Achievement: 06/09/17 Potential to Achieve Goals: Good Progress towards PT goals: Progressing toward goals    Frequency    Min 3X/week      PT Plan Current plan remains appropriate    Co-evaluation              AM-PAC PT "6 Clicks" Daily Activity   Outcome Measure  Difficulty turning over in bed (including adjusting bedclothes, sheets and blankets)?: Unable Difficulty moving from lying on back to sitting on the side of the bed? : Unable Difficulty sitting down on and standing up from a chair with arms (e.g., wheelchair, bedside commode, etc,.)?: Unable Help needed moving to and from a bed to chair (including a wheelchair)?: Total Help needed walking in hospital room?: Total Help needed climbing 3-5 steps with a railing? : Total 6 Click Score: 6    End of Session Equipment Utilized During Treatment: Gait belt Activity Tolerance: Patient tolerated treatment well Patient left: in chair;with call bell/phone within reach;with chair alarm set;with family/visitor present Nurse Communication: Mobility status PT Visit Diagnosis: Pain;Difficulty in walking, not elsewhere classified (R26.2);History of falling (Z91.81) Pain - part of body: Hip;Leg     Time: 6962-9528 PT Time Calculation (min) (ACUTE ONLY): 18 min  Charges:  $Gait Training: 8-22 mins                    G Codes:       Conni Slipper, PT, DPT Acute Rehabilitation Services Pager: [phone]    [name] 05/30/2017, 9:33 AM

## 2017-05-30 NOTE — Care Management Important Message (Signed)
Important Message  Patient Details  Name: Charlotte Huber MRN: [id number] Date of Birth: 08/10/1918   Medicare Important Message Given:  Yes    Jemaine Prokop 05/30/2017, 2:19 PM

## 2017-05-30 NOTE — Social Work (Addendum)
CSW informed that [facility] is ready to take pt whenever discharge summary is available.  CSW will arrange transportation for pt via [facility] at that time.   12:40pm- CSW informed per MD that pt will not be discharging today, EKG was done and pt will be kept for continued medical work up. [facility] aware.   CSW continuing to follow to [facility].   Doy HutchingIsabel H Statia Burdick, LCSWA [facility] Clinical Social Work [phone]

## 2017-05-30 NOTE — Progress Notes (Signed)
PROGRESS NOTE    Charlotte Huber  WUJ:[id number] DOB: 11/27/1918 DOA: 05/25/2017 PCP: Patient, No Pcp Per   Brief Narrative: Charlotte Huber is a 84 y.o. femalewith medical history significant forHTN, dementia, AICD, osteoporosis who [facility] after an unwitnessed fall that occurred yesterday around 1700. Patient had bowel incontinence earlier that day. Prior to that was in her usual state of health.In the ED a pelvis portable xray revealed:Comminuted intertrochanteric fracture on the left with varus angulation at fracture site. No other fractures are evident. No dislocation. There is symmetric moderate narrowing of the hip joints bilaterally. Bones are diffusely osteoporotic.Orthopedic surgery consulted and patient underwent surgical repair this afternoon 05/25/17. She has been intermittently agitated and Cr is elevated so Lisinopril-HCTZ discontinued. Blood Count dropped so she was transfused 1 unit of pRBC's.   Assessment & Plan:   Active Problems:   Closed left hip fracture (HCC)   Unwitnessed fall   Osteoporosis   Acute kidney injury superimposed on chronic kidney disease (HCC)   Normocytic anemia   Acute blood loss as cause of postoperative anemia   Malnutrition (HCC)   Hypomagnesemia   Benign essential HTN   Leukocytosis   Acute delirium   Right hip pain   Closed left hip fracture Patient is s/p intramedullary implant on 05/25/17. -PT recommendations: SNF -Orthopedic surgery recommendations: Lovenox  Unwitnessed fall CT head without abnormalities. Patient with sitter overnight. No events. -Cardiology consulted for pacer interrogation  Osteoporosis -Continue fall precautions, vitamin D and calcium  Acute kidney injury on CKD 3 Unknown baseline. Creatinine of 1.23 on admission. Initially worsened with a peak of 1.64. Now improved to 1.36 with fluids/blood. -BMP in AM  Normocytic anemia Blood loss anemia Baseline in addition to recent surgery.  Hemoglobin 7.8 s/p 1 unit of PRBC on 05/27/17. Stable. -CBC in AM  Moderate protein calorie malnutrition -RD recommending protein supplements but patient declining  Hypomagnesemia Given supplementation.  Essential hypertension Continue metoprolol and hydralazine prn  Leukocytosis Improved today. In setting of acute stress and recent surgery. Afebrile.  Acute delirium/agitation Patient with dementia. Likely secondary to pain. Resolved.  Right hip pain CT unremarkable for acute fracture.  Recurrent tachycardia Does not appear to be afib but has recurrent episodes of PVCs -Cardiology consult -Increase metoprolol to 50 mg   DVT prophylaxis: Lovenox Code Status: DNR Family Communication: Son at bedside Disposition Plan: Discharge to SNF when medically stable   Consultants:   Orthopedic surgery  Procedures:   Treatment of intertrochanteric fracture with intramedullary implant  Antimicrobials:  Cefazolin    Subjective: No issues overnight. Afebrile. Tachycardia to 150s overnight.  Objective: Vitals:   05/28/17 1952 05/29/17 0438 05/29/17 1443 05/29/17 2200  BP: 112/61 (!) 144/66 (!) 138/54 (!) 142/54  Pulse: 90 (!) 104 70 80  Resp: 17 19 (!) 22 (!) 21  Temp: 98 F (36.7 C) 98 F (36.7 C) 98.1 F (36.7 C) 98.2 F (36.8 C)  TempSrc: Oral Oral Oral Oral  SpO2: 99% 100% 98% 97%  Weight:        Intake/Output Summary (Last 24 hours) at 05/30/2017 1246 Last data filed at 05/30/2017 0617 Gross per 24 hour  Intake 2600 ml  Output 100 ml  Net 2500 ml   Filed Weights   05/25/17 0300  Weight: 52 kg (114 lb 10.2 oz)    Examination:  General exam: Appears calm and comfortable Respiratory system: Clear to auscultation. Respiratory effort normal. Cardiovascular system: S1 & S2 heard, RRR. No  murmurs, rubs, gallops or clicks. Gastrointestinal system: Abdomen is nondistended, soft and nontender. No organomegaly or masses felt. Normal bowel sounds heard. Central  nervous system: Alert. Bilateral hearing loss. No focal neurological deficits. Extremities: No edema. No calf tenderness. Skin: No cyanosis. Large area of ecchymosis of lateral left hip without overlying tenderness Psychiatry: Judgement and insight appear impaired. Flat affect    Data Reviewed: I have personally reviewed following labs and imaging studies  CBC: Recent Labs  Lab 05/25/17 0551 05/25/17 2020 05/26/17 0653 05/27/17 0427 05/28/17 0405  WBC 14.4* 17.2* 18.3* 15.1* 11.6*  NEUTROABS 12.3*  --   --  11.6*  --   HGB 9.1* 8.5* 7.3* 7.0* 7.8*  HCT 27.8* 26.1* 23.4* 21.6* 23.4*  MCV 98.6 98.9 99.2 100.0 95.9  PLT 203 204 170 154 153   Basic Metabolic Panel: Recent Labs  Lab 05/25/17 0551 05/25/17 2020 05/26/17 0653 05/27/17 0427 05/28/17 1053  NA 142  --  143 144 137  K 3.8  --  3.9 3.8 3.7  CL 106  --  112* 113* 106  CO2 27  --  23 22 22   GLUCOSE 138*  --  127* 110* 107*  BUN 26*  --  32* 38* 42*  CREATININE 1.23* 1.18* 1.44* 1.64* 1.36*  CALCIUM 9.2  --  8.4* 8.5* 8.9  MG  --   --   --  1.5*  --   PHOS  --   --   --  3.4  --    GFR: CrCl cannot be calculated (Unknown ideal weight.). Liver Function Tests: Recent Labs  Lab 05/25/17 0551 05/27/17 0427  AST 21 121*  ALT 13* 9*  ALKPHOS 50 42  BILITOT 0.7 0.7  PROT 5.8* 5.3*  ALBUMIN 3.4* 2.9*   No results for input(s): LIPASE, AMYLASE in the last 168 hours. No results for input(s): AMMONIA in the last 168 hours. Coagulation Profile: Recent Labs  Lab 05/25/17 0551  INR 1.14   Cardiac Enzymes: No results for input(s): CKTOTAL, CKMB, CKMBINDEX, TROPONINI in the last 168 hours. BNP (last 3 results) No results for input(s): PROBNP in the last 8760 hours. HbA1C: No results for input(s): HGBA1C in the last 72 hours. CBG: No results for input(s): GLUCAP in the last 168 hours. Lipid Profile: No results for input(s): CHOL, HDL, LDLCALC, TRIG, CHOLHDL, LDLDIRECT in the last 72 hours. Thyroid Function  Tests: No results for input(s): TSH, T4TOTAL, FREET4, T3FREE, THYROIDAB in the last 72 hours. Anemia Panel: No results for input(s): VITAMINB12, FOLATE, FERRITIN, TIBC, IRON, RETICCTPCT in the last 72 hours. Sepsis Labs: No results for input(s): PROCALCITON, LATICACIDVEN in the last 168 hours.  Recent Results (from the past 240 hour(s))  MRSA PCR Screening     Status: None   Collection Time: 05/25/17 10:44 AM  Result Value Ref Range Status   MRSA by PCR NEGATIVE NEGATIVE Final    Comment:        The GeneXpert MRSA Assay (FDA approved for NASAL specimens only), is one component of a comprehensive MRSA colonization surveillance program. It is not intended to diagnose MRSA infection nor to guide or monitor treatment for MRSA infections.          Radiology Studies: No results found.      Scheduled Meds: . cholecalciferol  1,000 Units Oral Daily  . enoxaparin (LOVENOX) injection  30 mg Subcutaneous Q24H  . [START ON 05/31/2017] metoprolol tartrate  50 mg Oral Daily  . polyethylene glycol  17 g  Oral BID  . senna-docusate  1 tablet Oral BID   Continuous Infusions: . lactated ringers 10 mL/hr at 05/25/17 1524  . methocarbamol (ROBAXIN)  IV Stopped (05/29/17 0253)     LOS: 5 days     Jacquelin Hawking, MD [facility] 05/30/2017, 12:46 PM Pager: [phone]  If 7PM-7AM, please contact night-coverage [email or web address] Password [facility] 05/30/2017, 12:46 PM

## 2017-05-31 ENCOUNTER — Other Ambulatory Visit: Payer: Self-pay

## 2017-05-31 DIAGNOSIS — S72002A Fracture of unspecified part of neck of left femur, initial encounter for closed fracture: Secondary | ICD-10-CM

## 2017-05-31 LAB — BASIC METABOLIC PANEL
Anion gap: 9 (ref 5–15)
BUN: 28 mg/dL — ABNORMAL HIGH (ref 6–20)
CHLORIDE: 109 mmol/L (ref 101–111)
CO2: 23 mmol/L (ref 22–32)
Calcium: 8.9 mg/dL (ref 8.9–10.3)
Creatinine, Ser: 0.88 mg/dL (ref 0.44–1.00)
GFR calc non Af Amer: 54 mL/min — ABNORMAL LOW (ref >60)
Glucose, Bld: 87 mg/dL (ref 65–99)
Potassium: 3.1 mmol/L — ABNORMAL LOW (ref 3.5–5.1)
SODIUM: 141 mmol/L (ref 135–145)

## 2017-05-31 LAB — CBC
HCT: 26.6 % — ABNORMAL LOW (ref 36.0–46.0)
HEMOGLOBIN: 8.7 g/dL — AB (ref 12.0–15.0)
MCH: 32 pg (ref 26.0–34.0)
MCHC: 32.7 g/dL (ref 30.0–36.0)
MCV: 97.8 fL (ref 78.0–100.0)
Platelets: 252 10*3/uL (ref 150–400)
RBC: 2.72 MIL/uL — AB (ref 3.87–5.11)
RDW: 15.3 % (ref 11.5–15.5)
WBC: 8.6 10*3/uL (ref 4.0–10.5)

## 2017-05-31 MED ORDER — QUETIAPINE FUMARATE 50 MG PO TABS
25.0000 mg | ORAL_TABLET | Freq: Two times a day (BID) | ORAL | Status: DC
  Administered 2017-05-31 – 2017-06-01 (×3): 25 mg via ORAL
  Filled 2017-05-31 (×3): qty 1

## 2017-05-31 MED ORDER — SODIUM CHLORIDE 0.9 % IV SOLN
INTRAVENOUS | Status: DC
  Administered 2017-05-31: 18:00:00 via INTRAVENOUS

## 2017-05-31 MED ORDER — POTASSIUM CHLORIDE CRYS ER 20 MEQ PO TBCR
40.0000 meq | EXTENDED_RELEASE_TABLET | Freq: Once | ORAL | Status: AC
  Administered 2017-05-31: 40 meq via ORAL
  Filled 2017-05-31: qty 2

## 2017-05-31 MED ORDER — METOPROLOL TARTRATE 50 MG PO TABS
50.0000 mg | ORAL_TABLET | Freq: Two times a day (BID) | ORAL | Status: DC
  Administered 2017-05-31 – 2017-06-01 (×2): 50 mg via ORAL
  Filled 2017-05-31 (×2): qty 1

## 2017-05-31 NOTE — Social Work (Signed)
CSW called by MD, pt not discharging today.   CSW will inform SNF that pt will not be discharging.   CSW continuing to follow to support discharge when medically appropriate.   Doy HutchingIsabel H Nichlos Huber, LCSWA [facility] Clinical Social Work [phone]

## 2017-05-31 NOTE — Progress Notes (Signed)
PROGRESS NOTE    Charlotte Huber  ZOX:[id number] DOB: March 10, 1919 DOA: 05/25/2017 PCP: Patient, No Pcp Per   Brief Narrative: Charlotte Huber is a 84 y.o. femalewith medical history significant forHTN, dementia, AICD, osteoporosis who presented at [facility] from home after an unwitnessed fall that occurred yesterday around 1700. Patient had bowel incontinence earlier that day. Prior to that was in her usual state of health.In the ED a pelvis portable xray revealed:Comminuted intertrochanteric fracture on the left with varus angulation at fracture site. No other fractures are evident. No dislocation. There is symmetric moderate narrowing of the hip joints bilaterally. Bones are diffusely osteoporotic.Orthopedic surgery consulted and patient underwent surgical repair this afternoon 05/25/17. She has been intermittently agitated and Cr is elevated so Lisinopril-HCTZ discontinued. Blood Count dropped so she was transfused 1 unit of pRBC's.   Assessment & Plan:   Active Problems:   Closed left hip fracture (HCC)   Unwitnessed fall   Osteoporosis   Acute kidney injury superimposed on chronic kidney disease (HCC)   Normocytic anemia   Acute blood loss as cause of postoperative anemia   Malnutrition (HCC)   Hypomagnesemia   Benign essential HTN   Leukocytosis   Acute delirium   Right hip pain   Closed left hip fracture Patient is s/p intramedullary implant on 05/25/17. -PT recommendations: SNF -Orthopedic surgery recommendations: Lovenox  Unwitnessed fall CT head without abnormalities. Patient with sitter overnight. No events. -Cardiology consulted for pacer interrogation  Osteoporosis -Continue fall precautions, vitamin D and calcium  Acute kidney injury on CKD 3 Unknown baseline. Creatinine of 1.23 on admission. Initially worsened with a peak of 1.64. Now improved to 1.36 with fluids/blood. -BMP in AM  Normocytic anemia Blood loss anemia Baseline in addition to recent surgery.  Hemoglobin 7.8 s/p 1 unit of PRBC on 05/27/17. Stable. -CBC in AM  Moderate protein calorie malnutrition -RD recommending protein supplements but patient declining  Hypomagnesemia Given supplementation.  Essential hypertension Continue metoprolol and hydralazine prn  Leukocytosis Improved today. In setting of acute stress and recent surgery. Afebrile.  Acute delirium/agitation Patient with dementia. Likely secondary to pain. Resolved.  Right hip pain CT unremarkable for acute fracture.  Recurrent tachycardia regular appears to be MAT. Does not appear to be afib but has recurrent episodes of PVCs -Cardiology consult -Increase metoprolol to 50 mg twice daily.  Gentle IV hydration.   DVT prophylaxis: Lovenox Code Status: DNR Family Communication: Son at bedside Disposition Plan: Discharge to SNF when medically stable   Consultants:   Orthopedic surgery  Procedures:   Treatment of intertrochanteric fracture with intramedullary implant  Antimicrobials:  Cefazolin    Subjective: No acute complaint although appears dehydrated.  Objective: Vitals:   05/30/17 1255 05/30/17 2147 05/31/17 0611 05/31/17 1420  BP: (!) 146/82 (!) 150/91 (!) 163/71 (!) 165/89  Pulse: 80 76 99 67  Resp: 17 18 16 16   Temp:  98.8 F (37.1 C) (!) 97.5 F (36.4 C) 97.8 F (36.6 C)  TempSrc:  Oral Oral Oral  SpO2: 100% 91% 99% 96%  Weight:      Height: 5\' 2"  (1.575 m)       Intake/Output Summary (Last 24 hours) at 05/31/2017 1735 Last data filed at 05/31/2017 1300 Gross per 24 hour  Intake 120 ml  Output 1 ml  Net 119 ml   Filed Weights   05/25/17 0300  Weight: 52 kg (114 lb 10.2 oz)    Examination:  General exam: Appears calm and comfortable Respiratory system:  Clear to auscultation. Respiratory effort normal. Cardiovascular system: S1 & S2 heard, RRR. No murmurs, rubs, gallops or clicks. Gastrointestinal system: Abdomen is nondistended, soft and nontender. No organomegaly or  masses felt. Normal bowel sounds heard. Central nervous system: Alert. Bilateral hearing loss. No focal neurological deficits. Extremities: No edema. No calf tenderness. Skin: No cyanosis. Large area of ecchymosis of lateral left hip without overlying tenderness Psychiatry: Judgement and insight appear impaired. Flat affect    Data Reviewed: I have personally reviewed following labs and imaging studies  CBC: Recent Labs  Lab 05/25/17 0551 05/25/17 2020 05/26/17 0653 05/27/17 0427 05/28/17 0405 05/31/17 0708  WBC 14.4* 17.2* 18.3* 15.1* 11.6* 8.6  NEUTROABS 12.3*  --   --  11.6*  --   --   HGB 9.1* 8.5* 7.3* 7.0* 7.8* 8.7*  HCT 27.8* 26.1* 23.4* 21.6* 23.4* 26.6*  MCV 98.6 98.9 99.2 100.0 95.9 97.8  PLT 203 204 170 154 153 252   Basic Metabolic Panel: Recent Labs  Lab 05/25/17 0551 05/25/17 2020 05/26/17 0653 05/27/17 0427 05/28/17 1053 05/31/17 0708  NA 142  --  143 144 137 141  K 3.8  --  3.9 3.8 3.7 3.1*  CL 106  --  112* 113* 106 109  CO2 27  --  23 22 22 23   GLUCOSE 138*  --  127* 110* 107* 87  BUN 26*  --  32* 38* 42* 28*  CREATININE 1.23* 1.18* 1.44* 1.64* 1.36* 0.88  CALCIUM 9.2  --  8.4* 8.5* 8.9 8.9  MG  --   --   --  1.5*  --   --   PHOS  --   --   --  3.4  --   --    GFR: Estimated Creatinine Clearance: 29.6 mL/min (by C-G formula based on SCr of 0.88 mg/dL). Liver Function Tests: Recent Labs  Lab 05/25/17 0551 05/27/17 0427  AST 21 121*  ALT 13* 9*  ALKPHOS 50 42  BILITOT 0.7 0.7  PROT 5.8* 5.3*  ALBUMIN 3.4* 2.9*   No results for input(s): LIPASE, AMYLASE in the last 168 hours. No results for input(s): AMMONIA in the last 168 hours. Coagulation Profile: Recent Labs  Lab 05/25/17 0551  INR 1.14   Cardiac Enzymes: No results for input(s): CKTOTAL, CKMB, CKMBINDEX, TROPONINI in the last 168 hours. BNP (last 3 results) No results for input(s): PROBNP in the last 8760 hours. HbA1C: No results for input(s): HGBA1C in the last 72  hours. CBG: No results for input(s): GLUCAP in the last 168 hours. Lipid Profile: No results for input(s): CHOL, HDL, LDLCALC, TRIG, CHOLHDL, LDLDIRECT in the last 72 hours. Thyroid Function Tests: No results for input(s): TSH, T4TOTAL, FREET4, T3FREE, THYROIDAB in the last 72 hours. Anemia Panel: No results for input(s): VITAMINB12, FOLATE, FERRITIN, TIBC, IRON, RETICCTPCT in the last 72 hours. Sepsis Labs: No results for input(s): PROCALCITON, LATICACIDVEN in the last 168 hours.  Recent Results (from the past 240 hour(s))  MRSA PCR Screening     Status: None   Collection Time: 05/25/17 10:44 AM  Result Value Ref Range Status   MRSA by PCR NEGATIVE NEGATIVE Final    Comment:        The GeneXpert MRSA Assay (FDA approved for NASAL specimens only), is one component of a comprehensive MRSA colonization surveillance program. It is not intended to diagnose MRSA infection nor to guide or monitor treatment for MRSA infections.          Radiology  Studies: No results found.      Scheduled Meds: . cholecalciferol  1,000 Units Oral Daily  . enoxaparin (LOVENOX) injection  30 mg Subcutaneous Q24H  . metoprolol tartrate  50 mg Oral BID  . polyethylene glycol  17 g Oral BID  . QUEtiapine  25 mg Oral BID  . senna-docusate  1 tablet Oral BID   Continuous Infusions: . methocarbamol (ROBAXIN)  IV Stopped (05/29/17 0253)     LOS: 6 days     Lynden Oxford, MD [facility] 05/31/2017, 5:35 PM  If 7PM-7AM, please contact night-coverage [email or web address] Password [facility] 05/31/2017, 5:35 PM

## 2017-06-01 LAB — BASIC METABOLIC PANEL
ANION GAP: 6 (ref 5–15)
BUN: 25 mg/dL — ABNORMAL HIGH (ref 6–20)
CHLORIDE: 112 mmol/L — AB (ref 101–111)
CO2: 24 mmol/L (ref 22–32)
CREATININE: 0.85 mg/dL (ref 0.44–1.00)
Calcium: 8.7 mg/dL — ABNORMAL LOW (ref 8.9–10.3)
GFR calc non Af Amer: 56 mL/min — ABNORMAL LOW (ref >60)
Glucose, Bld: 93 mg/dL (ref 65–99)
POTASSIUM: 3.7 mmol/L (ref 3.5–5.1)
SODIUM: 142 mmol/L (ref 135–145)

## 2017-06-01 LAB — CBC
HCT: 26.7 % — ABNORMAL LOW (ref 36.0–46.0)
HEMOGLOBIN: 8.3 g/dL — AB (ref 12.0–15.0)
MCH: 30.3 pg (ref 26.0–34.0)
MCHC: 31.1 g/dL (ref 30.0–36.0)
MCV: 97.4 fL (ref 78.0–100.0)
PLATELETS: 259 10*3/uL (ref 150–400)
RBC: 2.74 MIL/uL — ABNORMAL LOW (ref 3.87–5.11)
RDW: 15.2 % (ref 11.5–15.5)
WBC: 10.1 10*3/uL (ref 4.0–10.5)

## 2017-06-01 LAB — MAGNESIUM: Magnesium: 1.6 mg/dL — ABNORMAL LOW (ref 1.7–2.4)

## 2017-06-01 MED ORDER — METOPROLOL TARTRATE 50 MG PO TABS: 50.0000 mg | ORAL_TABLET | Freq: Two times a day (BID) | ORAL | 0 refills | Status: AC

## 2017-06-01 MED ORDER — QUETIAPINE FUMARATE 25 MG PO TABS: 25.0000 mg | ORAL_TABLET | Freq: Every day | ORAL | 0 refills | Status: AC

## 2017-06-01 MED ORDER — POLYETHYLENE GLYCOL 3350 17 G PO PACK: 17.0000 g | PACK | Freq: Two times a day (BID) | ORAL | 0 refills | Status: AC

## 2017-06-01 MED ORDER — POTASSIUM CHLORIDE CRYS ER 20 MEQ PO TBCR
40.0000 meq | EXTENDED_RELEASE_TABLET | Freq: Once | ORAL | Status: AC
  Administered 2017-06-01: 40 meq via ORAL
  Filled 2017-06-01: qty 2

## 2017-06-01 MED ORDER — QUETIAPINE FUMARATE 25 MG PO TABS: 12.5000 mg | ORAL_TABLET | ORAL | 0 refills | Status: AC

## 2017-06-01 MED ORDER — MAGNESIUM SULFATE 2 GM/50ML IV SOLN
2.0000 g | Freq: Once | INTRAVENOUS | Status: AC
  Administered 2017-06-01: 2 g via INTRAVENOUS
  Filled 2017-06-01: qty 50

## 2017-06-01 NOTE — Discharge Summary (Signed)
[facility] Discharge Summary   Patient: Charlotte Huber ZOX:[id number]   PCP: Patient, No Pcp Per DOB: July 31, 1918   Date of admission: 05/25/2017   Date of discharge:  06/01/2017    Discharge Diagnoses:  Active Problems:   Closed left hip fracture (HCC)   Unwitnessed fall   Osteoporosis   Acute kidney injury superimposed on chronic kidney disease (HCC)   Normocytic anemia   Acute blood loss as cause of postoperative anemia   Malnutrition (HCC)   Hypomagnesemia   Benign essential HTN   Leukocytosis   Acute delirium   Right hip pain   Admitted From: home Disposition:  SNF  Recommendations for Outpatient Follow-up:  1. Please follow up with PCP in 1 week, need to establish care on discharge from SNF    Contact information for follow-up providers    Tarry Kos, MD Follow up in 2 week(s).   Specialty:  Orthopedic Surgery Why:  For suture removal, For wound re-check Contact information: [address] [place] [address] [phone]        PCP. Schedule an appointment as soon as possible for a visit in 1 week(s).   Why:  needs a PCP on discharge from SNF           Contact information for after-discharge care    Destination    [facility] Follow up.   Service:  Skilled Nursing Contact information: [address] [place] [address] [phone]                 Diet recommendation: regular diet  Activity: The patient is advised to gradually reintroduce usual activities.  Discharge Condition: good  Code Status: DNR DNI  History of present illness: As per the H and P dictated on admission, "Charlotte Huber is a 84 y.o. female with medical history significant for HTN, dementia, AICD, osteoporosis who presented at [facility] from home after an unwitnessed fall that occurred yesterday around 1700. Patient had bowel incontinence earlier that day. Prior to that was in her usual state of health.   ED Course: Pelvis  portable xray revealed: Comminuted intertrochanteric fracture on the left with varus angulation at fracture site. No other fractures are evident. No dislocation. There is symmetric moderate narrowing of the hip joints bilaterally. Bones are diffusely osteoporotic.   Orthopedic surgery consulted with plan for surgical repair this afternoon 05/25/17. Chest xray and EKG ordered for medical clearance."  Hospital Course:  Summary of her active problems in the hospital is as following. Closed left hip fracture Patient is s/p intramedullary implant on 05/25/17. -PT recommendations: SNF -Orthopedic surgery recommendations: Lovenox  Unwitnessed fall CT head without abnormalities. Patient with sitter overnight. No events.  Osteoporosis -Continue fall precautions, vitamin D and calcium  Acute kidney injury on CKD 3 Unknown baseline. Creatinine of 1.23 on admission. Initially worsened with a peak of 1.64. Now improved to 0.8 with fluids/blood. Avoid lisinopril and HCTZ now.   Normocytic anemia Acute Blood loss anemia Baseline in addition to recent surgery.  s/p 1 unit of PRBC on 05/27/17. Stable.  Moderate protein calorie malnutrition -RD recommending protein supplements but patient declining  Hypomagnesemia Given supplementation.  Essential hypertension Continue metoprolol, well controlled on single regimen  Hold lisinopril and HCTZ for now.   Leukocytosis In setting of acute stress and recent surgery. Afebrile.  Acute delirium/agitation Patient with dementia. Likely secondary to pain. Resolved. Continue seroquel  Right hip pain CT unremarkable for acute fracture.  Recurrent  tachycardia iirregular MAT. Does not appear to be afib but has recurrent episodes of PVCs Increase metoprolol to 50 mg twice daily.  Follow up with cardiologist as an outpat   Left foot drop From injury Ankle foot orthosis   All other chronic medical condition were stable during the  hospitalization.  Patient was seen by physical therapy, who recommended SNF, which was arranged by Child psychotherapist and case Production designer, theatre/television/film. On the day of the discharge the patient's vitals were stable, and no other acute medical condition were reported by patient. the patient was felt safe to be discharge at SNF with therapy.  Procedures and Results:  Treatment of intertrochanteric fracture with intramedullary nail  implant   Consultations:  Orthopedics dr Roda Shutters  DISCHARGE MEDICATION: Allergies as of 06/01/2017   No Known Allergies     Medication List    STOP taking these medications   lisinopril-hydrochlorothiazide 20-12.5 MG tablet Commonly known as:  PRINZIDE,ZESTORETIC     TAKE these medications   cholecalciferol 1000 units tablet Commonly known as:  VITAMIN D Take 1,000 Units by mouth daily.   enoxaparin 30 MG/0.3ML injection Commonly known as:  LOVENOX Inject 0.3 mLs (30 mg total) into the skin daily.   metoprolol tartrate 50 MG tablet Commonly known as:  LOPRESSOR Take 1 tablet (50 mg total) by mouth 2 (two) times daily. What changed:    medication strength  how much to take  when to take this   oxyCODONE 5 MG immediate release tablet Commonly known as:  Oxy IR/ROXICODONE Take 1-3 tablets (5-15 mg total) by mouth every 4 (four) hours as needed.   polyethylene glycol packet Commonly known as:  MIRALAX / GLYCOLAX Take 17 g by mouth 2 (two) times daily.   QUEtiapine 25 MG tablet Commonly known as:  SEROQUEL Take 1 tablet (25 mg total) by mouth at bedtime.   QUEtiapine 25 MG tablet Commonly known as:  SEROQUEL Take 0.5 tablets (12.5 mg total) by mouth every morning.            Discharge Care Instructions  (From admission, onward)        Start     Ordered   05/25/17 0000  Weight bearing as tolerated     05/25/17 1723     No Known Allergies Discharge Instructions    Diet general   Complete by:  As directed    Discharge instructions   Complete by:  As  directed    It is important that you read following instructions as well as go over your medication list with RN to help you understand your care after this hospitalization.  Discharge Instructions: Please follow-up with PCP in one week  Please request your primary care physician to go over all Hospital Tests and Procedure/Radiological results at the follow up,  Please get all Hospital records sent to your PCP by signing hospital release before you go home.   Do not take more than prescribed Pain, Sleep and Anxiety Medications. You were cared for by a hospitalist during your hospital stay. If you have any questions about your discharge medications or the care you received while you were in the hospital after you are discharged, you can call the unit and ask to speak with the hospitalist on call if the hospitalist that took care of you is not available.  Once you are discharged, your primary care physician will handle any further medical issues. Please note that NO REFILLS for any discharge medications will be authorized once you  are discharged, as it is imperative that you return to your primary care physician (or establish a relationship with a primary care physician if you do not have one) for your aftercare needs so that they can reassess your need for medications and monitor your lab values. You Must read complete instructions/literature along with all the possible adverse reactions/side effects for all the Medicines you take and that have been prescribed to you. Take any new Medicines after you have completely understood and accept all the possible adverse reactions/side effects. Wear Seat belts while driving.   Increase activity slowly   Complete by:  As directed    Weight bearing as tolerated   Complete by:  As directed      Discharge Exam: Filed Weights   05/25/17 0300  Weight: 52 kg (114 lb 10.2 oz)   Vitals:   06/01/17 0522 06/01/17 1032  BP: (!) 145/80 (!) 130/57  Pulse: 73  92  Resp: 17 18  Temp: 97.8 F (36.6 C) 98 F (36.7 C)  SpO2: 94%    General: Appear in mild distress, no Rash; Oral Mucosa moist. Cardiovascular: S1 and S2 Present, no Murmur, no JVD Respiratory: Bilateral Air entry present and Clear to Auscultation, no Crackles, no wheezes Abdomen: Bowel Sound present, Soft and no tenderness Extremities: no Pedal edema, no calf tenderness, left foot drop.  Neurology: Grossly no focal neuro deficit.  The results of significant diagnostics from this hospitalization (including imaging, microbiology, ancillary and laboratory) are listed below for reference.    Significant Diagnostic Studies: Ct Head Wo Contrast  Result Date: 05/26/2017 CLINICAL DATA:  Blunt head trauma.  Headache.  Initial encounter. EXAM: CT HEAD WITHOUT CONTRAST TECHNIQUE: Contiguous axial images were obtained from the base of the skull through the vertex without intravenous contrast. COMPARISON:  None. FINDINGS: Technically suboptimal due to motion artifact. Brain: No evidence of acute infarction, hemorrhage, hydrocephalus, extra-axial collection, or mass lesion/mass effect. Mild to moderate diffuse cerebral atrophy and chronic small vessel disease. Vascular:  No hyperdense vessel or other acute findings. Skull: No evidence of fracture or other significant bone abnormality. Sinuses/Orbits:  No acute findings. Other: None. IMPRESSION: No acute intracranial abnormality. Mild to moderate cerebral atrophy and chronic small vessel disease. Electronically Signed   By: Myles Rosenthal M.D.   On: 05/26/2017 15:27   Pelvis Portable  Result Date: 05/25/2017 CLINICAL DATA:  Left hip fracture fixation. EXAM: DG C-ARM 61-120 MIN; PORTABLE PELVIS 1-2 VIEWS; LEFT FEMUR 2 VIEWS COMPARISON:  Radiographs, same date. FINDINGS: Multiple intraoperative fluoroscopic spot images and 2 postoperative films demonstrate a long stem gamma nail in place with 2 proximal compression screws. No distal interlocking screws. The  fracture is reduced. No complicating features. IMPRESSION: Close reduction and internal fixation of an intertrochanteric fracture of the left hip. Good position and alignment and no complicating features. Electronically Signed   By: Rudie Meyer M.D.   On: 05/25/2017 19:19   Dg Pelvis Portable  Result Date: 05/25/2017 CLINICAL DATA:  Pain following fall EXAM: PORTABLE PELVIS 1-2 VIEWS COMPARISON:  None. FINDINGS: There is a comminuted intertrochanteric femur fracture on the left with varus angulation at the fracture site. There is avulsion of the lesser trochanter medially. No other fracture is appreciable. No dislocation. Bones are diffusely osteoporotic. There is moderate narrowing of both hip joints. IMPRESSION: Comminuted intertrochanteric fracture on the left with varus angulation at fracture site. No other fractures are evident. No dislocation. There is symmetric moderate narrowing of the hip joints bilaterally. Bones  are diffusely osteoporotic. These results will be called to the ordering clinician or representative by the Radiologist Assistant, and communication documented in the PACS or zVision Dashboard. Electronically Signed   By: Bretta BangWilliam  Woodruff III M.D.   On: 05/25/2017 07:45   Ct Hip Right Wo Contrast  Result Date: 05/27/2017 CLINICAL DATA:  Recent fall with left hip intertrochanteric fracture status post ORIF, now with worsening right hip pain and concern for fracture. EXAM: CT OF THE RIGHT HIP WITHOUT CONTRAST TECHNIQUE: Multidetector CT imaging of the right hip was performed according to the standard protocol. Multiplanar CT image reconstructions were also generated. COMPARISON:  Pelvic x-rays dated May 25, 2017. FINDINGS: Bones/Joint/Cartilage No definite acute fracture. There is a large, well corticated ossific density along the posterosuperior margin of the right hip joint. There is moderate concentric right hip joint space narrowing. No hip joint effusion. The bones are  diffusely osteopenic. Old healed fracture of the right inferior pubic ramus. Ligaments Suboptimally assessed by CT. Muscles and Tendons Atrophy of the gluteal muscles. Soft tissues Mild subcutaneous edema about the right hip. The visualized intrapelvic structures are unremarkable. IMPRESSION: 1. No definite acute fracture of the right hip. Large, well corticated ossific density along the posterosuperior margin of the right hip joint may be related to prior trauma or heterotopic ossification. If occult hip fracture is suspected or if the patient is unable to bear weight, consider MRI for further evaluation. 2. Moderate right hip osteoarthritis. Electronically Signed   By: Obie DredgeWilliam T Derry M.D.   On: 05/27/2017 11:56   Dg Chest Port 1 View  Result Date: 05/25/2017 CLINICAL DATA:  Preoperative evaluation for upcoming left hip surgery EXAM: PORTABLE CHEST 1 VIEW COMPARISON:  09/10/2007 FINDINGS: Cardiac shadow is mildly enlarged but stable. Pacing device is now seen. The lungs are well-aerated without focal infiltrate. Calcification mitral annulus is noted. Old right humeral fracture with healing is seen. No acute bony abnormality is noted. IMPRESSION: Chronic changes without acute abnormality. Electronically Signed   By: Alcide CleverMark  Lukens M.D.   On: 05/25/2017 09:34   Dg C-arm 1-60 Min  Result Date: 05/25/2017 CLINICAL DATA:  Left hip fracture fixation. EXAM: DG C-ARM 61-120 MIN; PORTABLE PELVIS 1-2 VIEWS; LEFT FEMUR 2 VIEWS COMPARISON:  Radiographs, same date. FINDINGS: Multiple intraoperative fluoroscopic spot images and 2 postoperative films demonstrate a long stem gamma nail in place with 2 proximal compression screws. No distal interlocking screws. The fracture is reduced. No complicating features. IMPRESSION: Close reduction and internal fixation of an intertrochanteric fracture of the left hip. Good position and alignment and no complicating features. Electronically Signed   By: Rudie MeyerP.  Gallerani M.D.   On:  05/25/2017 19:19   Dg Femur Min 2 Views Left  Result Date: 05/25/2017 CLINICAL DATA:  Left hip fracture fixation. EXAM: DG C-ARM 61-120 MIN; PORTABLE PELVIS 1-2 VIEWS; LEFT FEMUR 2 VIEWS COMPARISON:  Radiographs, same date. FINDINGS: Multiple intraoperative fluoroscopic spot images and 2 postoperative films demonstrate a long stem gamma nail in place with 2 proximal compression screws. No distal interlocking screws. The fracture is reduced. No complicating features. IMPRESSION: Close reduction and internal fixation of an intertrochanteric fracture of the left hip. Good position and alignment and no complicating features. Electronically Signed   By: Rudie MeyerP.  Gallerani M.D.   On: 05/25/2017 19:19    Microbiology: Recent Results (from the past 240 hour(s))  MRSA PCR Screening     Status: None   Collection Time: 05/25/17 10:44 AM  Result Value Ref Range Status  MRSA by PCR NEGATIVE NEGATIVE Final    Comment:        The GeneXpert MRSA Assay (FDA approved for NASAL specimens only), is one component of a comprehensive MRSA colonization surveillance program. It is not intended to diagnose MRSA infection nor to guide or monitor treatment for MRSA infections.      Labs: CBC: Recent Labs  Lab 05/26/17 0653 05/27/17 0427 05/28/17 0405 05/31/17 0708 06/01/17 0617  WBC 18.3* 15.1* 11.6* 8.6 10.1  NEUTROABS  --  11.6*  --   --   --   HGB 7.3* 7.0* 7.8* 8.7* 8.3*  HCT 23.4* 21.6* 23.4* 26.6* 26.7*  MCV 99.2 100.0 95.9 97.8 97.4  PLT 170 154 153 252 259   Basic Metabolic Panel: Recent Labs  Lab 05/26/17 0653 05/27/17 0427 05/28/17 1053 05/31/17 0708 06/01/17 0617  NA 143 144 137 141 142  K 3.9 3.8 3.7 3.1* 3.7  CL 112* 113* 106 109 112*  CO2 23 22 22 23 24   GLUCOSE 127* 110* 107* 87 93  BUN 32* 38* 42* 28* 25*  CREATININE 1.44* 1.64* 1.36* 0.88 0.85  CALCIUM 8.4* 8.5* 8.9 8.9 8.7*  MG  --  1.5*  --   --  1.6*  PHOS  --  3.4  --   --   --    Liver Function Tests: Recent Labs    Lab 05/27/17 0427  AST 121*  ALT 9*  ALKPHOS 42  BILITOT 0.7  PROT 5.3*  ALBUMIN 2.9*  Time spent: 35 minutes  Signed:  Lynden Oxford  [facility]  06/01/2017  , 10:35 AM

## 2017-06-01 NOTE — Clinical Social Work Placement (Signed)
   CLINICAL SOCIAL WORK PLACEMENT  NOTE [facility]  Date:  06/01/2017  Patient Details  Name: Charlotte Huber MRN: [id number] Date of Birth: 10/12/1918  Clinical Social Work is seeking post-discharge placement for this patient at the Skilled  Nursing Facility level of care (*CSW will initial, date and re-position this form in  chart as items are completed):  Yes   Patient/family provided with [place] Clinical Social Work Department's list of facilities offering this level of care within the geographic area requested by the patient (or if unable, by the patient's family).  Yes   Patient/family informed of their freedom to choose among providers that offer the needed level of care, that participate in Medicare, Medicaid or managed care program needed by the patient, have an available bed and are willing to accept the patient.  Yes   Patient/family informed of 's ownership interest in [facility] and [facility], as well as of the fact that they are under no obligation to receive care at these facilities.  PASRR submitted to EDS on 05/26/17     PASRR number received on 05/26/17     Existing PASRR number confirmed on       FL2 transmitted to all facilities in geographic area requested by pt/family on 05/26/17     FL2 transmitted to all facilities within larger geographic area on       Patient informed that his/her managed care company has contracts with or will negotiate with certain facilities, including the following:        Yes   Patient/family informed of bed offers received.  [facility]     Physician recommends and patient chooses bed at      [facility].  Patient to be transferred to facility by [facility]     Patient family notified on 06/01/17 of transfer.  Name of family member notified:  Lauretta GrillLewis Rijo, son     PHYSICIAN Please sign DNR,  Please sign FL2     Additional Comment:    _______________________________________________ Doy HutchingIsabel H Verenise Moulin, LCSWA 06/01/2017, 10:57 AM

## 2017-06-01 NOTE — Social Work (Signed)
Clinical Social Worker facilitated patient discharge including contacting patient family and facility to confirm patient discharge plans.  Clinical information faxed to facility and family agreeable with plan.  CS[facility]  RN to call [phone] with report prior to discharge.  Clinical Social Worker will sign off for now as social work intervention is no longer needed. Please consult us again if new need arises.  Doy HutchingIsabel H Ezme Duch, LCSWA Clinical Social Worker

## 2017-06-01 NOTE — Progress Notes (Signed)
Orthopedic Tech Progress Note Patient Details:  [facility] 03/22/1919 [id number]  Ortho Devices Ortho Device/Splint Location: Prafo boot Ortho Device/Splint Interventions: Application   Post Interventions Patient Tolerated: Well Instructions Provided: Care of device   Saul FordyceJennifer C Abdiaziz Klahn 06/01/2017, 11:58 AM

## 2017-06-01 NOTE — Progress Notes (Signed)
[facility]. Report called and spoke to Wyman SongsterLauren Mitchel - LPN using [facility]AR. All questions were answered.

## 2017-06-01 NOTE — Progress Notes (Signed)
Physical Therapy Treatment Patient Details Name: Charlotte Huber MRN: [id number] DOB: 17-Feb-1919 Today's Date: 06/01/2017    History of Present Illness 84 y.o. female with medical history significant for HTN, dementia, AICD, osteoporosis who presented at [facility] from home after an unwitnessed fall. Now s/p INTRAMEDULLARY (IM) NAIL INTERTROCHANTRIC (Left Hip).    PT Comments    Pt progressing towards physical therapy goals. Was able to perform transfers and bed mobility with +2 max to total assist for all aspects of mobility. Pt's husband present and asked for the second time if therapy was going to "walk her". He was educated again about realistic expected mobility progression, and that at this time, pt is not able to take any steps without therapist providing 100% support/assistance. PRAFO arrived during session and PT adjusted and provided education to pt's son regarding positioning and use of kickstand for neutral alignment of LLE. Will continue to follow.   Follow Up Recommendations  SNF;Supervision/Assistance - 24 hour     Equipment Recommendations  None recommended by PT(TBD by next venue of care)    Recommendations for Other Services       Precautions / Restrictions Precautions Precautions: Fall Restrictions Weight Bearing Restrictions: Yes LLE Weight Bearing: Weight bearing as tolerated    Mobility  Bed Mobility Overal bed mobility: Needs Assistance Bed Mobility: Sit to Supine       Sit to supine: Total assist;+2 for physical assistance   General bed mobility comments: With use of bed pad and helicopter technique. Pt required max assist for all scooting and positioning in bed.   Transfers Overall transfer level: Needs assistance Equipment used: 2 person hand held assist Transfers: Sit to/from [employer] Sit to Stand: Total assist;+2 physical assistance Stand pivot transfers: Total assist;+2 physical assistance       General transfer comment: Total  assist +2 with gait belt for sit to stand and stand pivot toward R side. Pt noted to have bilateral knee buckle with weight shift to the R to pivot.   Ambulation/Gait             General Gait Details: Unable at this time.    Stairs            Wheelchair Mobility    Modified Rankin (Stroke Patients Only)       Balance Overall balance assessment: Needs assistance Sitting-balance support: Feet supported;Bilateral upper extremity supported Sitting balance-Leahy Scale: Poor     Standing balance support: Bilateral upper extremity supported Standing balance-Leahy Scale: Zero Standing balance comment: Total assist +2 for standing balance.                            Cognition Arousal/Alertness: Awake/alert Behavior During Therapy: Flat affect Overall Cognitive Status: No family/caregiver present to determine baseline cognitive functioning                                 General Comments: Pt is very HOH; difficult to differentiate cognitive deficits vs unable to hear questions/commands. Pt with baseline dementia per chart.      Exercises      General Comments        Pertinent Vitals/Pain Pain Assessment: Faces Faces Pain Scale: Hurts whole lot Pain Location: bilateral LE's with movement Pain Descriptors / Indicators: Grimacing;Operative site guarding Pain Intervention(s): Limited activity within patient's tolerance;Monitored during session;Repositioned    Home Living  Prior Function            PT Goals (current goals can now be found in the care plan section) Acute Rehab PT Goals Patient Stated Goal: none stated PT Goal Formulation: Patient unable to participate in goal setting Time For Goal Achievement: 06/09/17 Potential to Achieve Goals: Good Progress towards PT goals: Progressing toward goals    Frequency    Min 3X/week      PT Plan Current plan remains appropriate    Co-evaluation               AM-PAC PT "6 Clicks" Daily Activity  Outcome Measure  Difficulty turning over in bed (including adjusting bedclothes, sheets and blankets)?: Unable Difficulty moving from lying on back to sitting on the side of the bed? : Unable Difficulty sitting down on and standing up from a chair with arms (e.g., wheelchair, bedside commode, etc,.)?: Unable Help needed moving to and from a bed to chair (including a wheelchair)?: Total Help needed walking in hospital room?: Total Help needed climbing 3-5 steps with a railing? : Total 6 Click Score: 6    End of Session Equipment Utilized During Treatment: Gait belt Activity Tolerance: Patient tolerated treatment well Patient left: in bed;with call bell/phone within reach;with bed alarm set;with family/visitor present Nurse Communication: Mobility status PT Visit Diagnosis: Pain;Difficulty in walking, not elsewhere classified (R26.2);History of falling (Z91.81) Pain - Right/Left: Left Pain - part of body: Hip;Leg     Time: 1130-1153 PT Time Calculation (min) (ACUTE ONLY): 23 min  Charges:  $Gait Training: 23-37 mins                    G Codes:       Conni SlipperLaura Tomeeka Plaugher, PT, DPT Acute Rehabilitation Services Pager: [phone]    Marylynn PearsonLaura D Wylan Gentzler 06/01/2017, 1:04 PM

## 2017-06-11 ENCOUNTER — Inpatient Hospital Stay (INDEPENDENT_AMBULATORY_CARE_PROVIDER_SITE_OTHER): Payer: Medicare Other | Admitting: Orthopaedic Surgery

## 2017-06-18 ENCOUNTER — Inpatient Hospital Stay (INDEPENDENT_AMBULATORY_CARE_PROVIDER_SITE_OTHER): Payer: Medicare Other | Admitting: Orthopaedic Surgery

## 2017-06-29 DEATH — deceased

## 2019-09-02 IMAGING — DX DG CHEST 1V PORT
1 series · 1 of 1 positions shown · non-contrast
Comparison: 09/10/2007

CLINICAL DATA: Preoperative evaluation for upcoming left hip
surgery

EXAM:
PORTABLE CHEST 1 VIEW

[chest ap]
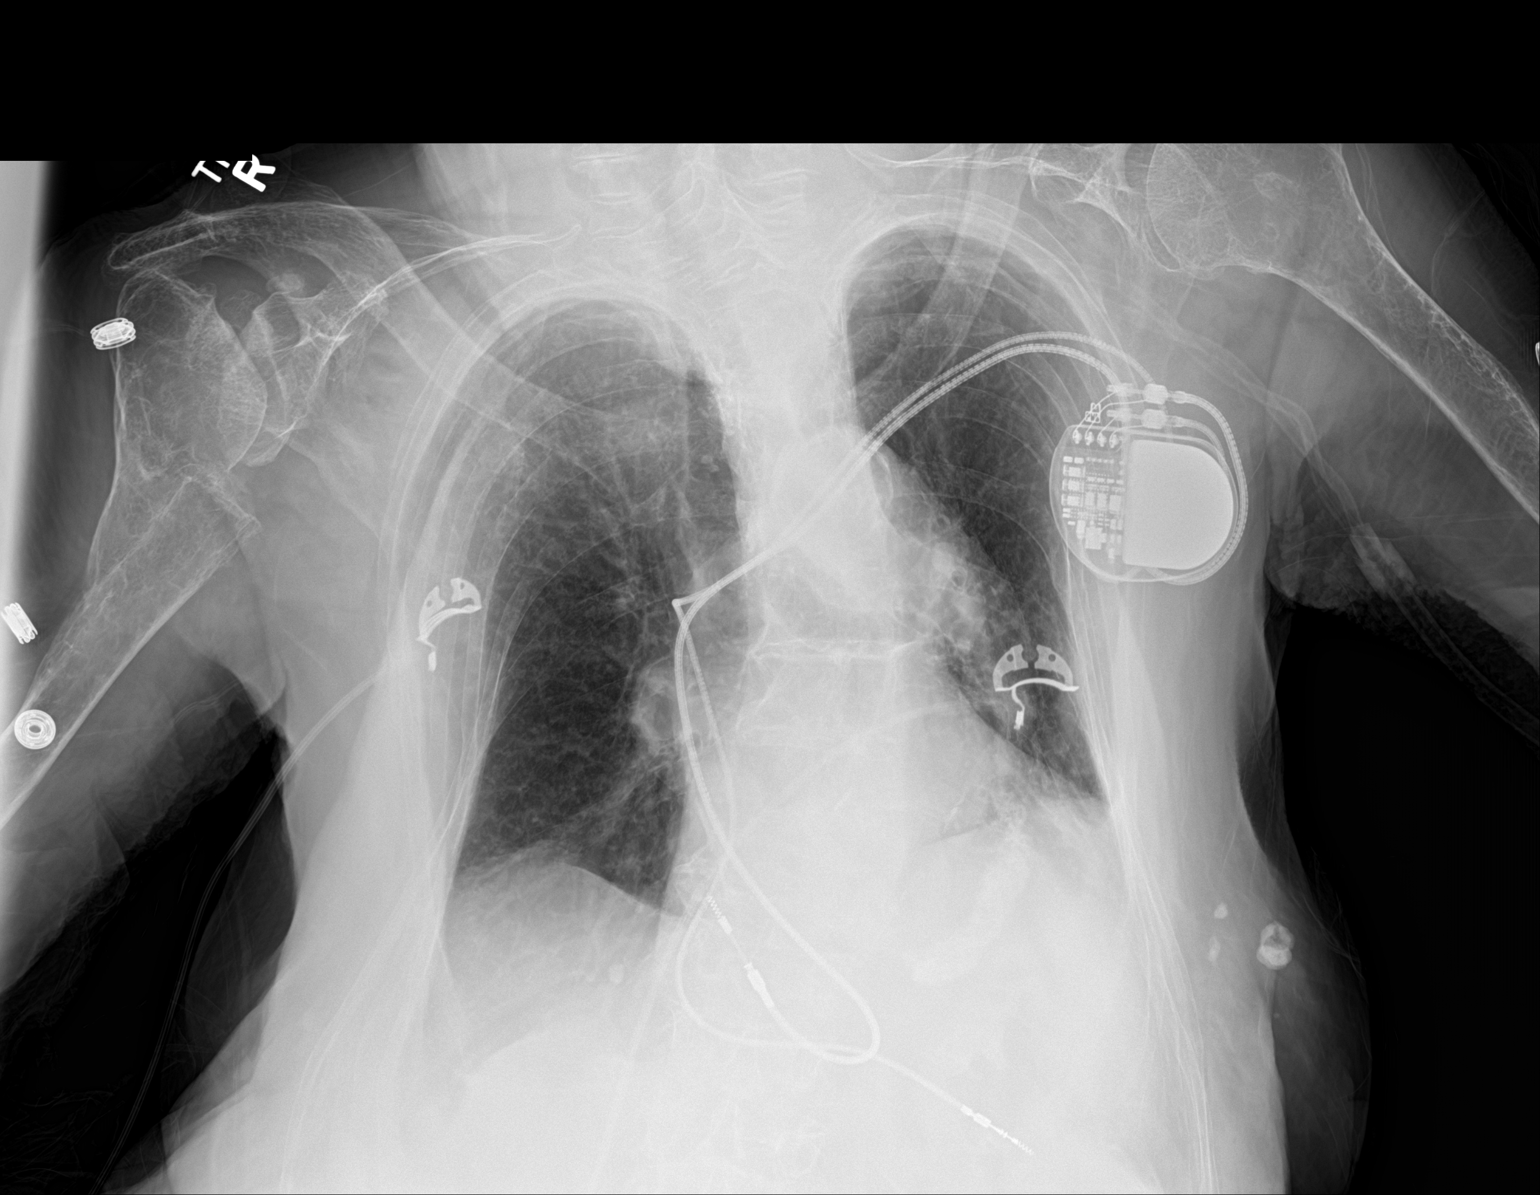

[1 of 1 positions shown; findings below may reference images not displayed]

FINDINGS: Cardiac shadow is mildly enlarged but stable. Pacing device is now
seen. The lungs are well-aerated without focal infiltrate.
Calcification mitral annulus is noted. Old right humeral fracture
with healing is seen. No acute bony abnormality is noted.
IMPRESSION: Chronic changes without acute abnormality.

## 2019-09-04 IMAGING — CT CT HIP*R* W/O CM
2 of 5 series · 16 of 46 positions shown, 19 images · non-contrast
Comparison: Pelvic x-rays dated May 25, 2017.

CLINICAL DATA: Recent fall with left hip intertrochanteric fracture
status post ORIF, now with worsening right hip pain and concern for
fracture.

EXAM:
CT OF THE RIGHT HIP WITHOUT CONTRAST
TECHNIQUE: Multidetector CT imaging of the right hip was performed according to
the standard protocol. Multiplanar CT image reconstructions were
also generated.

[Series 18: pelvis 2.0 st · axial · 0.41mm/px · z∈[+837,+1025]mm · 13 of 105 slices shown, 16 images]
[im 7/105  soft-tissue]
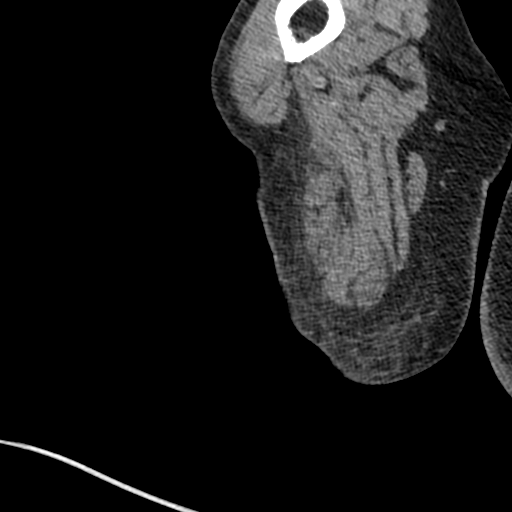
[im 7/105  bone]
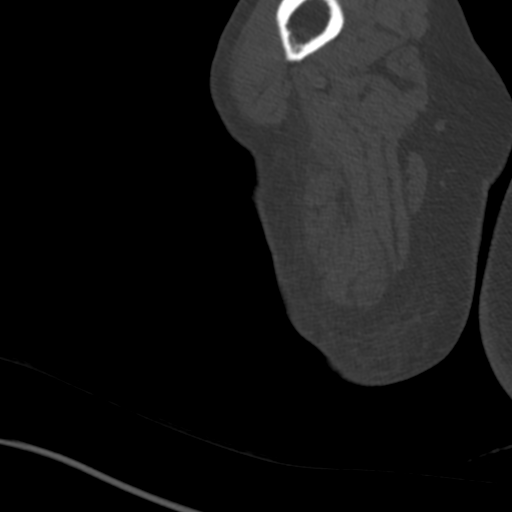
[im 17/105  soft-tissue]
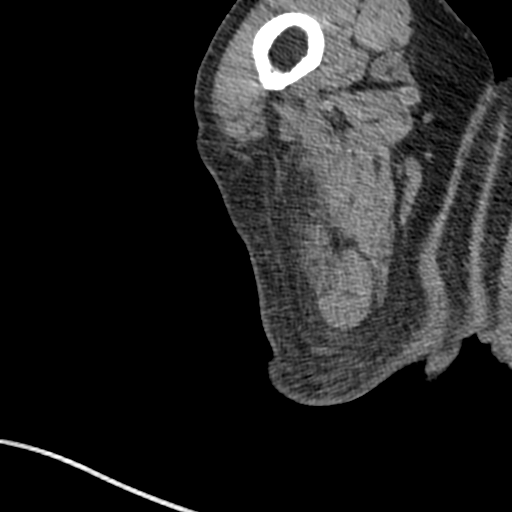
[im 27/105  soft-tissue]
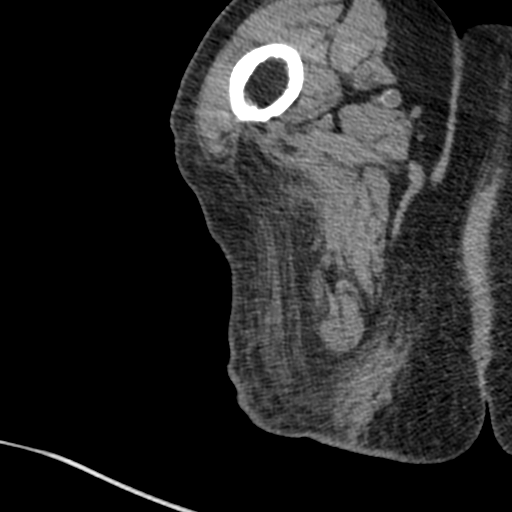
[im 37/105  soft-tissue]
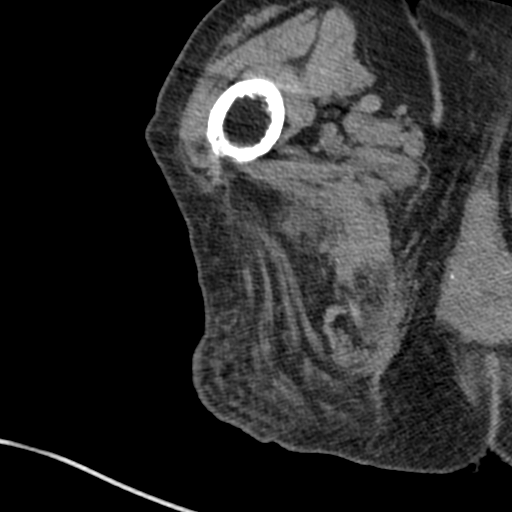
[im 47/105  soft-tissue]
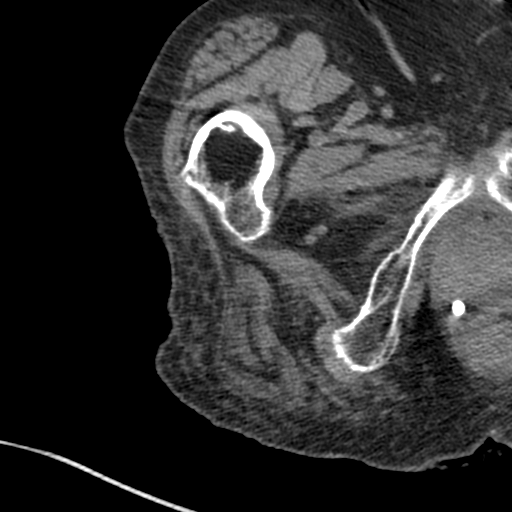
[im 58/105  soft-tissue]
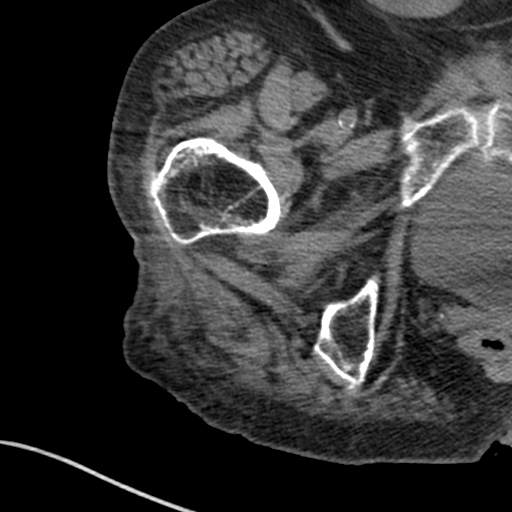
[im 68/105  soft-tissue]
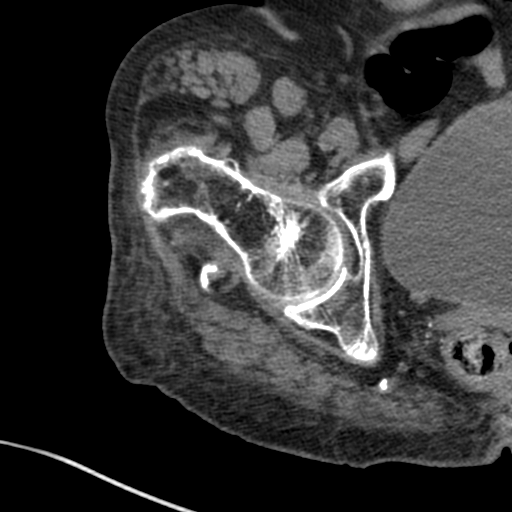
[im 78/105  soft-tissue]
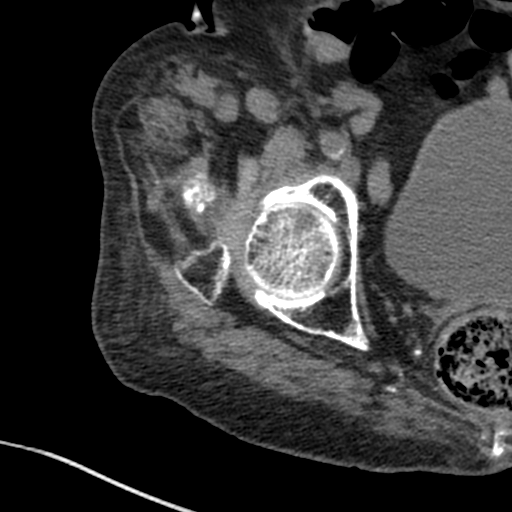
[im 88/105  soft-tissue]
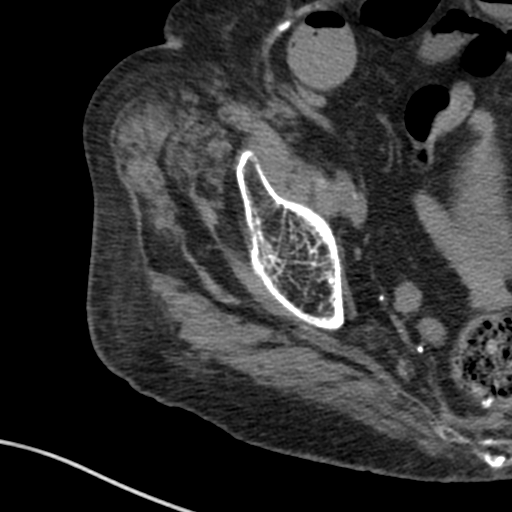
[im 88/105  bone]
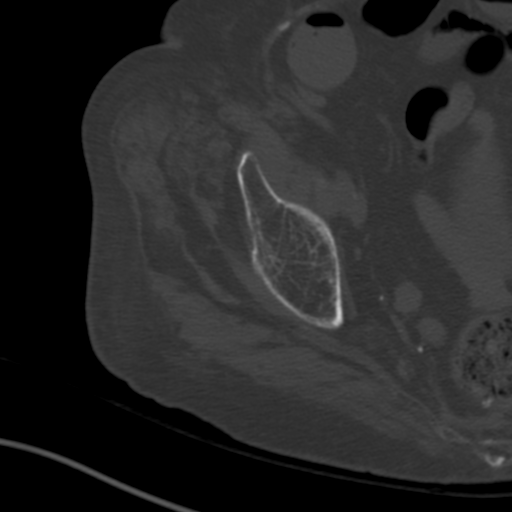
[im 91/105  lung]
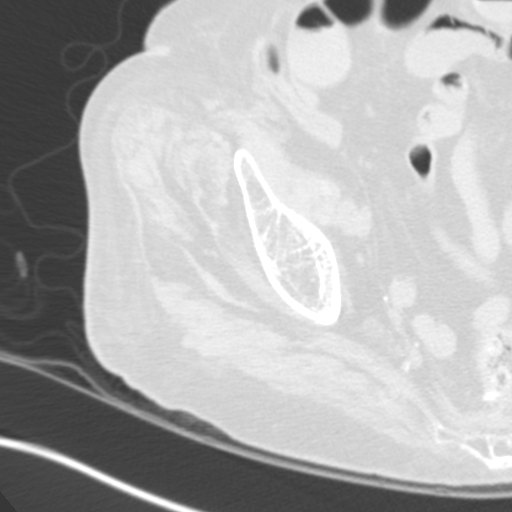
[im 94/105  lung]
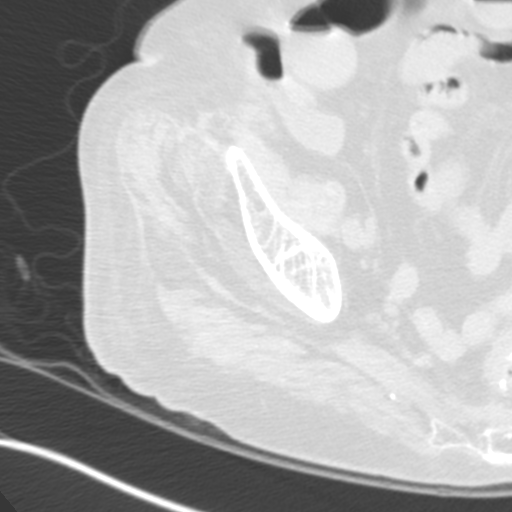
[im 98/105  soft-tissue]
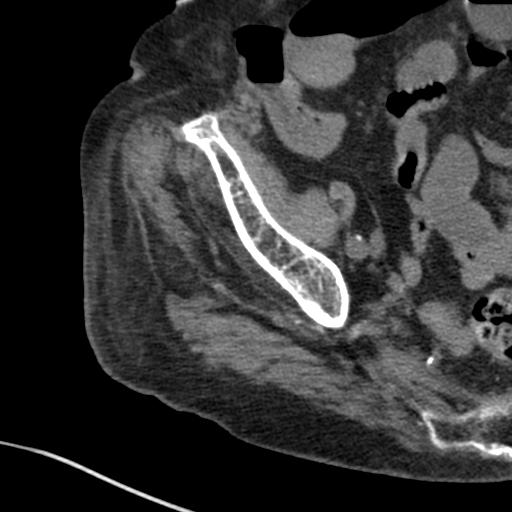
[im 98/105  lung]
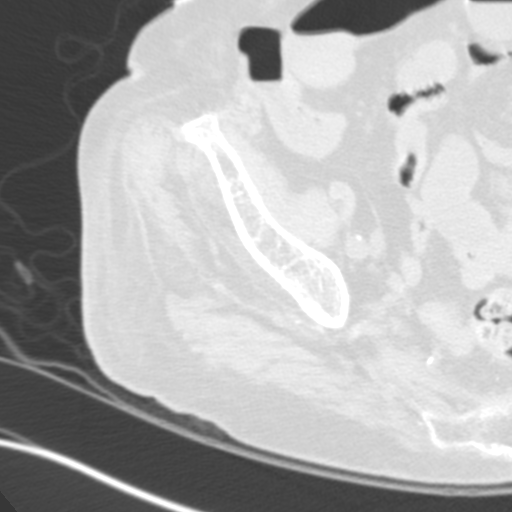
[im 101/105  lung]
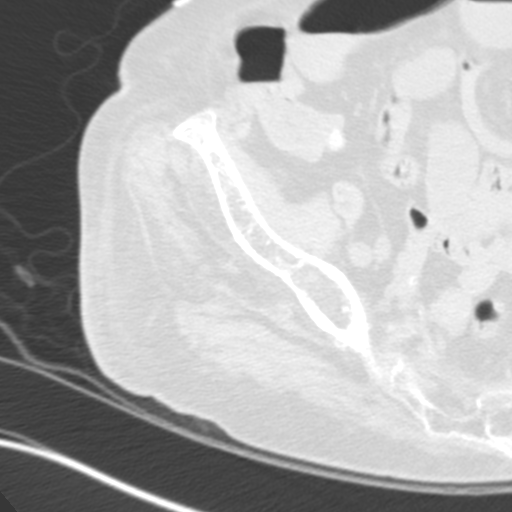

[Series 602: coronal st · coronal · 0.60mm/px · 3 of 69 slices shown]
[im 18/69  soft-tissue]
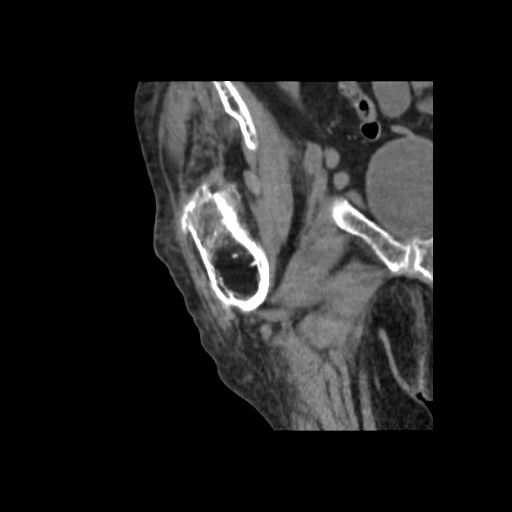
[im 35/69  soft-tissue]
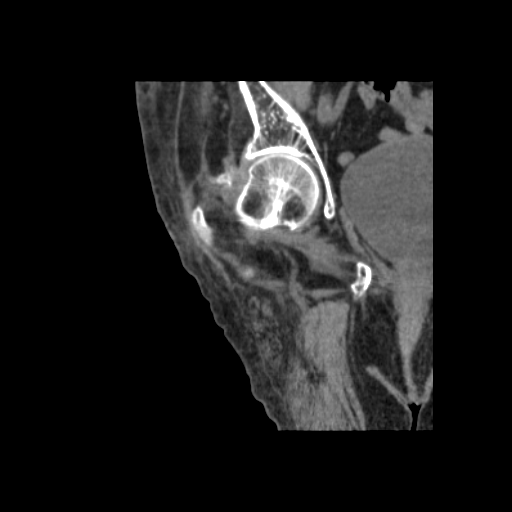
[im 52/69  soft-tissue]
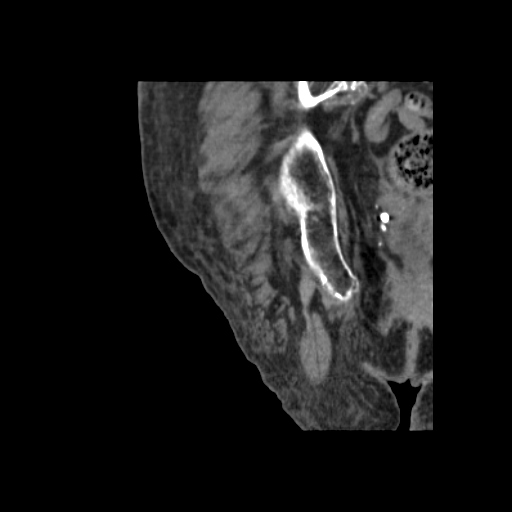

[16 of 46 positions shown; findings below may reference images not displayed]

FINDINGS: Bones/Joint/Cartilage

No definite acute fracture. There is a large, well corticated
ossific density along the posterosuperior margin of the right hip
joint. There is moderate concentric right hip joint space narrowing.
No hip joint effusion. The bones are diffusely osteopenic. Old
healed fracture of the right inferior pubic ramus.

Ligaments

Suboptimally assessed by CT.

Muscles and Tendons

Atrophy of the gluteal muscles.

Soft tissues

Mild subcutaneous edema about the right hip. The visualized
intrapelvic structures are unremarkable.
IMPRESSION: 1. No definite acute fracture of the right hip. Large, well
corticated ossific density along the posterosuperior margin of the
right hip joint may be related to prior trauma or heterotopic
ossification. If occult hip fracture is suspected or if the patient
is unable to bear weight, consider MRI for further evaluation.
2. Moderate right hip osteoarthritis.
